# Patient Record
Sex: Female | Born: 1960 | Race: White | Hispanic: No | Marital: Single | Smoking: Never smoker
Health system: Southern US, Community
[De-identification: ages and names within clinical notes are randomized; demographics above are authoritative.]

## PROBLEM LIST (undated history)

## (undated) DIAGNOSIS — F319 Bipolar disorder, unspecified: Secondary | ICD-10-CM

## (undated) DIAGNOSIS — E039 Hypothyroidism, unspecified: Secondary | ICD-10-CM

## (undated) DIAGNOSIS — F32A Depression, unspecified: Secondary | ICD-10-CM

## (undated) DIAGNOSIS — M1990 Unspecified osteoarthritis, unspecified site: Secondary | ICD-10-CM

## (undated) DIAGNOSIS — F0390 Unspecified dementia without behavioral disturbance: Secondary | ICD-10-CM

## (undated) DIAGNOSIS — E079 Disorder of thyroid, unspecified: Secondary | ICD-10-CM

## (undated) HISTORY — DX: Disorder of thyroid, unspecified: E07.9

## (undated) HISTORY — PX: SHOULDER SURGERY: SHX246

## (undated) HISTORY — PX: TONSILLECTOMY: SUR1361

## (undated) HISTORY — DX: Bipolar disorder, unspecified: F31.9

## (undated) HISTORY — PX: CHOLECYSTECTOMY: SHX55

---

## 2019-02-08 DIAGNOSIS — F319 Bipolar disorder, unspecified: Secondary | ICD-10-CM | POA: Insufficient documentation

## 2019-02-08 DIAGNOSIS — M8580 Other specified disorders of bone density and structure, unspecified site: Secondary | ICD-10-CM | POA: Insufficient documentation

## 2019-02-08 DIAGNOSIS — E039 Hypothyroidism, unspecified: Secondary | ICD-10-CM | POA: Insufficient documentation

## 2019-02-08 DIAGNOSIS — M810 Age-related osteoporosis without current pathological fracture: Secondary | ICD-10-CM | POA: Insufficient documentation

## 2020-02-24 DIAGNOSIS — Z419 Encounter for procedure for purposes other than remedying health state, unspecified: Secondary | ICD-10-CM | POA: Diagnosis not present

## 2020-03-26 DIAGNOSIS — Z419 Encounter for procedure for purposes other than remedying health state, unspecified: Secondary | ICD-10-CM | POA: Diagnosis not present

## 2020-04-25 DIAGNOSIS — E559 Vitamin D deficiency, unspecified: Secondary | ICD-10-CM | POA: Diagnosis not present

## 2020-04-25 DIAGNOSIS — Z131 Encounter for screening for diabetes mellitus: Secondary | ICD-10-CM | POA: Diagnosis not present

## 2020-04-25 DIAGNOSIS — Z130 Encounter for screening for diseases of the blood and blood-forming organs and certain disorders involving the immune mechanism: Secondary | ICD-10-CM | POA: Diagnosis not present

## 2020-04-25 DIAGNOSIS — Z13220 Encounter for screening for lipoid disorders: Secondary | ICD-10-CM | POA: Diagnosis not present

## 2020-04-25 DIAGNOSIS — E039 Hypothyroidism, unspecified: Secondary | ICD-10-CM | POA: Diagnosis not present

## 2020-04-25 DIAGNOSIS — M19011 Primary osteoarthritis, right shoulder: Secondary | ICD-10-CM | POA: Diagnosis not present

## 2020-04-25 DIAGNOSIS — Z79899 Other long term (current) drug therapy: Secondary | ICD-10-CM | POA: Diagnosis not present

## 2020-04-25 DIAGNOSIS — G4700 Insomnia, unspecified: Secondary | ICD-10-CM | POA: Diagnosis not present

## 2020-04-25 DIAGNOSIS — M25511 Pain in right shoulder: Secondary | ICD-10-CM | POA: Diagnosis not present

## 2020-04-25 DIAGNOSIS — F418 Other specified anxiety disorders: Secondary | ICD-10-CM | POA: Diagnosis not present

## 2020-04-26 DIAGNOSIS — Z419 Encounter for procedure for purposes other than remedying health state, unspecified: Secondary | ICD-10-CM | POA: Diagnosis not present

## 2020-05-11 DIAGNOSIS — Z79899 Other long term (current) drug therapy: Secondary | ICD-10-CM | POA: Diagnosis not present

## 2020-05-11 DIAGNOSIS — Z131 Encounter for screening for diabetes mellitus: Secondary | ICD-10-CM | POA: Diagnosis not present

## 2020-05-11 DIAGNOSIS — E039 Hypothyroidism, unspecified: Secondary | ICD-10-CM | POA: Diagnosis not present

## 2020-05-11 DIAGNOSIS — Z0000 Encounter for general adult medical examination without abnormal findings: Secondary | ICD-10-CM | POA: Diagnosis not present

## 2020-05-11 DIAGNOSIS — E559 Vitamin D deficiency, unspecified: Secondary | ICD-10-CM | POA: Diagnosis not present

## 2020-05-11 DIAGNOSIS — Z13220 Encounter for screening for lipoid disorders: Secondary | ICD-10-CM | POA: Diagnosis not present

## 2020-05-11 DIAGNOSIS — Z130 Encounter for screening for diseases of the blood and blood-forming organs and certain disorders involving the immune mechanism: Secondary | ICD-10-CM | POA: Diagnosis not present

## 2020-05-24 DIAGNOSIS — E039 Hypothyroidism, unspecified: Secondary | ICD-10-CM | POA: Diagnosis not present

## 2020-05-24 DIAGNOSIS — E785 Hyperlipidemia, unspecified: Secondary | ICD-10-CM | POA: Diagnosis not present

## 2020-05-24 DIAGNOSIS — M810 Age-related osteoporosis without current pathological fracture: Secondary | ICD-10-CM | POA: Diagnosis not present

## 2020-05-24 DIAGNOSIS — E559 Vitamin D deficiency, unspecified: Secondary | ICD-10-CM | POA: Diagnosis not present

## 2020-05-26 DIAGNOSIS — Z419 Encounter for procedure for purposes other than remedying health state, unspecified: Secondary | ICD-10-CM | POA: Diagnosis not present

## 2020-06-06 DIAGNOSIS — K219 Gastro-esophageal reflux disease without esophagitis: Secondary | ICD-10-CM | POA: Diagnosis not present

## 2020-06-06 DIAGNOSIS — M810 Age-related osteoporosis without current pathological fracture: Secondary | ICD-10-CM | POA: Diagnosis not present

## 2020-06-06 DIAGNOSIS — Z13820 Encounter for screening for osteoporosis: Secondary | ICD-10-CM | POA: Diagnosis not present

## 2020-06-06 DIAGNOSIS — E559 Vitamin D deficiency, unspecified: Secondary | ICD-10-CM | POA: Diagnosis not present

## 2020-06-06 DIAGNOSIS — G4700 Insomnia, unspecified: Secondary | ICD-10-CM | POA: Diagnosis not present

## 2020-06-06 DIAGNOSIS — E039 Hypothyroidism, unspecified: Secondary | ICD-10-CM | POA: Diagnosis not present

## 2020-06-06 DIAGNOSIS — R42 Dizziness and giddiness: Secondary | ICD-10-CM | POA: Diagnosis not present

## 2020-06-06 DIAGNOSIS — F418 Other specified anxiety disorders: Secondary | ICD-10-CM | POA: Diagnosis not present

## 2020-06-06 DIAGNOSIS — Z1231 Encounter for screening mammogram for malignant neoplasm of breast: Secondary | ICD-10-CM | POA: Diagnosis not present

## 2020-06-06 DIAGNOSIS — R413 Other amnesia: Secondary | ICD-10-CM | POA: Diagnosis not present

## 2020-06-26 DIAGNOSIS — Z419 Encounter for procedure for purposes other than remedying health state, unspecified: Secondary | ICD-10-CM | POA: Diagnosis not present

## 2020-06-29 ENCOUNTER — Other Ambulatory Visit: Payer: Self-pay

## 2020-06-29 ENCOUNTER — Encounter: Payer: Self-pay | Admitting: Emergency Medicine

## 2020-06-29 ENCOUNTER — Ambulatory Visit
Admission: EM | Admit: 2020-06-29 | Discharge: 2020-06-29 | Disposition: A | Discharge status: Home / Self Care | Payer: Medicaid Other | Attending: Emergency Medicine | Admitting: Emergency Medicine

## 2020-06-29 DIAGNOSIS — L089 Local infection of the skin and subcutaneous tissue, unspecified: Secondary | ICD-10-CM

## 2020-06-29 DIAGNOSIS — R220 Localized swelling, mass and lump, head: Secondary | ICD-10-CM

## 2020-06-29 DIAGNOSIS — S01501A Unspecified open wound of lip, initial encounter: Secondary | ICD-10-CM

## 2020-06-29 HISTORY — DX: Unspecified dementia, unspecified severity, without behavioral disturbance, psychotic disturbance, mood disturbance, and anxiety: F03.90

## 2020-06-29 HISTORY — DX: Bipolar disorder, unspecified: F31.9

## 2020-06-29 MED ORDER — CLINDAMYCIN HCL 300 MG PO CAPS: 300.0000 mg | ORAL_CAPSULE | Freq: Four times a day (QID) | ORAL | 0 refills | Status: AC

## 2020-06-29 MED ORDER — CEFTRIAXONE SODIUM 1 G IJ SOLR
1.0000 g | Freq: Once | INTRAMUSCULAR | Status: AC
  Administered 2020-06-29: 1 g via INTRAMUSCULAR

## 2020-06-29 MED ORDER — PREDNISONE 20 MG PO TABS: 20.0000 mg | ORAL_TABLET | Freq: Two times a day (BID) | ORAL | 0 refills | Status: AC

## 2020-06-29 MED ORDER — DEXAMETHASONE SODIUM PHOSPHATE 10 MG/ML IJ SOLN
10.0000 mg | Freq: Once | INTRAMUSCULAR | Status: AC
  Administered 2020-06-29: 10 mg via INTRAMUSCULAR

## 2020-06-29 NOTE — ED Triage Notes (Addendum)
Swelling to bottom lip and blisters on the LT side of cheek x 2 days.  Pt had a pimple there and has been picking at it.  Pt is taking amoxicillin 500 mg TID she is on her second day of abx

## 2020-06-29 NOTE — ED Provider Notes (Signed)
[facility]   [id number] 06/29/20 Arrival Time: 1643  CC: Lip infection and swelling   SUBJECTIVE:  Anita Berry is a 59 y.o. female who presents with lip swelling and possible infection x 2 days.  Initially started out like a pimple on her lip, but has been picking at it and has caused lip pain and swelling.  Concerned for infection.  Family member who is a Education officer, community started her on amoxicillin 1 day ago.  Has not noticed improvement.  Localizes symptoms to LT lower lip.  Has tried OTC analgesics without relief.  Worse with chewing.  Denies similar symptoms in the past.  Denies fever, chills, dysphagia, odynophagia, oral or neck swelling, nausea, vomiting, chest pain, SOB.    ROS: As per HPI.  All other pertinent ROS negative.     Past Medical History:  Diagnosis Date  . Bipolar 1 disorder (HCC)   . Dementia [facility])    Past Surgical History:  Procedure Laterality Date  . CHOLECYSTECTOMY    . SHOULDER SURGERY Right   . TONSILLECTOMY     Allergies  Allergen Reactions  . Sulfa Antibiotics Swelling   No current facility-administered medications on file prior to encounter.   No current outpatient medications on file prior to encounter.   Social History   Socioeconomic History  . Marital status: Single    Spouse name: Not on file  . Number of children: Not on file  . Years of education: Not on file  . Highest education level: Not on file  Occupational History  . Not on file  Tobacco Use  . Smoking status: Never Smoker  . Smokeless tobacco: Never Used  Substance and Sexual Activity  . Alcohol use: Yes    Comment: occ  . Drug use: Never  . Sexual activity: Not on file  Other Topics Concern  . Not on file  Social History Narrative  . Not on file   Social Determinants of Health   Financial Resource Strain:   . Difficulty of Paying Living Expenses: Not on file  Food Insecurity:   . Worried About Programme researcher, broadcasting/film/video in the Last Year: Not on file  . Ran Out of  Food in the Last Year: Not on file  Transportation Needs:   . Lack of Transportation (Medical): Not on file  . Lack of Transportation (Non-Medical): Not on file  Physical Activity:   . Days of Exercise per Week: Not on file  . Minutes of Exercise per Session: Not on file  Stress:   . Feeling of Stress : Not on file  Social Connections:   . Frequency of Communication with Friends and Family: Not on file  . Frequency of Social Gatherings with Friends and Family: Not on file  . Attends Religious Services: Not on file  . Active Member of Clubs or Organizations: Not on file  . Attends Banker Meetings: Not on file  . Marital Status: Not on file  Intimate Partner Violence:   . Fear of Current or Ex-Partner: Not on file  . Emotionally Abused: Not on file  . Physically Abused: Not on file  . Sexually Abused: Not on file   No family history on file.  OBJECTIVE:  Vitals:   06/29/20 1714 06/29/20 1715  BP:  138/90  Pulse:  66  Resp:  19  Temp:  98.7 F (37.1 C)  TempSrc:  Oral  SpO2:  98%  Weight: 165 lb (74.8 kg)   Height: 5\' 6"  (1.676  m)     General appearance: alert; no distress HENT: normocephalic; atraumatic; LT lower lip with swelling and scab formation on outside, inside of LT lower lip with pustule, no active drainage or bleeding, TTP, dentition; poor Neck: supple without LAD Lungs: normal respirations Skin: warm and dry Psychological: alert and cooperative; normal mood and affect  ASSESSMENT & PLAN:  1. Lip swelling   2. Open wound of lip, unspecified open wound type, initial encounter   3. Wound infection     Meds ordered this encounter  Medications  . predniSONE (DELTASONE) 20 MG tablet    Sig: Take 1 tablet (20 mg total) by mouth 2 (two) times daily with a meal for 5 days.    Dispense:  10 tablet    Refill:  0    Order Specific Question:   Supervising Provider    Answer:   Eustace Moore [[id number]]  . clindamycin (CLEOCIN) 300 MG capsule     Sig: Take 1 capsule (300 mg total) by mouth every 6 (six) hours for 10 days.    Dispense:  40 capsule    Refill:  0    Order Specific Question:   Supervising Provider    Answer:   Eustace Moore [[id number]]  . dexamethasone (DECADRON) injection 10 mg  . cefTRIAXone (ROCEPHIN) injection 1 g    Steroid shot given in office Rocephin given in office Prednisone prescribed Clindamycin prescribed Take medications as directed and to completion Maintain oral hygiene care Return or go to the ED if you have any new or worsening symptoms such as fever, chills, difficulty swallowing, painful swallowing, oral or neck swelling, nausea, vomiting, chest pain, SOB, etc...  Reviewed expectations re: course of current medical issues. Questions answered. Outlined signs and symptoms indicating need for more acute intervention. Patient verbalized understanding. After Visit Summary given.   Rennis Harding, PA-C 06/29/20 1730

## 2020-06-29 NOTE — Discharge Instructions (Signed)
Steroid shot given in office Rocephin given in office Prednisone prescribed Clindamycin prescribed Take medications as directed and to completion Maintain oral hygiene care Return or go to the ED if you have any new or worsening symptoms such as fever, chills, difficulty swallowing, painful swallowing, oral or neck swelling, nausea, vomiting, chest pain, SOB, etc..Marland Kitchen

## 2020-07-05 ENCOUNTER — Other Ambulatory Visit (HOSPITAL_COMMUNITY): Payer: Self-pay | Admitting: Family Medicine

## 2020-07-05 DIAGNOSIS — Z780 Asymptomatic menopausal state: Secondary | ICD-10-CM

## 2020-07-05 DIAGNOSIS — Z1231 Encounter for screening mammogram for malignant neoplasm of breast: Secondary | ICD-10-CM

## 2020-07-26 DIAGNOSIS — Z419 Encounter for procedure for purposes other than remedying health state, unspecified: Secondary | ICD-10-CM | POA: Diagnosis not present

## 2020-08-26 DIAGNOSIS — Z419 Encounter for procedure for purposes other than remedying health state, unspecified: Secondary | ICD-10-CM | POA: Diagnosis not present

## 2020-08-30 DIAGNOSIS — E039 Hypothyroidism, unspecified: Secondary | ICD-10-CM | POA: Diagnosis not present

## 2020-09-05 DIAGNOSIS — K219 Gastro-esophageal reflux disease without esophagitis: Secondary | ICD-10-CM | POA: Diagnosis not present

## 2020-09-05 DIAGNOSIS — M810 Age-related osteoporosis without current pathological fracture: Secondary | ICD-10-CM | POA: Diagnosis not present

## 2020-09-05 DIAGNOSIS — E039 Hypothyroidism, unspecified: Secondary | ICD-10-CM | POA: Diagnosis not present

## 2020-09-05 DIAGNOSIS — E663 Overweight: Secondary | ICD-10-CM | POA: Diagnosis not present

## 2020-09-05 DIAGNOSIS — Z6827 Body mass index (BMI) 27.0-27.9, adult: Secondary | ICD-10-CM | POA: Diagnosis not present

## 2020-09-05 DIAGNOSIS — Z79899 Other long term (current) drug therapy: Secondary | ICD-10-CM | POA: Diagnosis not present

## 2020-09-05 DIAGNOSIS — R413 Other amnesia: Secondary | ICD-10-CM | POA: Diagnosis not present

## 2020-09-05 DIAGNOSIS — E559 Vitamin D deficiency, unspecified: Secondary | ICD-10-CM | POA: Diagnosis not present

## 2020-09-05 DIAGNOSIS — R739 Hyperglycemia, unspecified: Secondary | ICD-10-CM | POA: Diagnosis not present

## 2020-09-26 DIAGNOSIS — Z419 Encounter for procedure for purposes other than remedying health state, unspecified: Secondary | ICD-10-CM | POA: Diagnosis not present

## 2020-10-24 DIAGNOSIS — Z419 Encounter for procedure for purposes other than remedying health state, unspecified: Secondary | ICD-10-CM | POA: Diagnosis not present

## 2020-11-23 DIAGNOSIS — R739 Hyperglycemia, unspecified: Secondary | ICD-10-CM | POA: Diagnosis not present

## 2020-11-23 DIAGNOSIS — E559 Vitamin D deficiency, unspecified: Secondary | ICD-10-CM | POA: Diagnosis not present

## 2020-11-23 DIAGNOSIS — E039 Hypothyroidism, unspecified: Secondary | ICD-10-CM | POA: Diagnosis not present

## 2020-11-24 DIAGNOSIS — Z419 Encounter for procedure for purposes other than remedying health state, unspecified: Secondary | ICD-10-CM | POA: Diagnosis not present

## 2020-11-30 DIAGNOSIS — E039 Hypothyroidism, unspecified: Secondary | ICD-10-CM | POA: Diagnosis not present

## 2020-11-30 DIAGNOSIS — E559 Vitamin D deficiency, unspecified: Secondary | ICD-10-CM | POA: Diagnosis not present

## 2020-11-30 DIAGNOSIS — K5900 Constipation, unspecified: Secondary | ICD-10-CM | POA: Diagnosis not present

## 2020-11-30 DIAGNOSIS — M810 Age-related osteoporosis without current pathological fracture: Secondary | ICD-10-CM | POA: Diagnosis not present

## 2020-12-24 DIAGNOSIS — Z419 Encounter for procedure for purposes other than remedying health state, unspecified: Secondary | ICD-10-CM | POA: Diagnosis not present

## 2021-01-04 ENCOUNTER — Ambulatory Visit
Admission: EM | Admit: 2021-01-04 | Discharge: 2021-01-04 | Disposition: A | Discharge status: Home / Self Care | Payer: Medicaid Other | Attending: Emergency Medicine | Admitting: Emergency Medicine

## 2021-01-04 ENCOUNTER — Encounter: Payer: Self-pay | Admitting: Emergency Medicine

## 2021-01-04 ENCOUNTER — Other Ambulatory Visit: Payer: Self-pay

## 2021-01-04 DIAGNOSIS — L02415 Cutaneous abscess of right lower limb: Secondary | ICD-10-CM

## 2021-01-04 MED ORDER — CEFTRIAXONE SODIUM 1 G IJ SOLR
1.0000 g | Freq: Once | INTRAMUSCULAR | Status: AC
  Administered 2021-01-04: 1 g via INTRAMUSCULAR

## 2021-01-04 MED ORDER — DOXYCYCLINE HYCLATE 100 MG PO CAPS: 100.0000 mg | ORAL_CAPSULE | Freq: Two times a day (BID) | ORAL | 0 refills | Status: DC

## 2021-01-04 NOTE — ED Triage Notes (Signed)
Insect bite to RT thigh

## 2021-01-04 NOTE — ED Provider Notes (Signed)
  [facility]   [id number] 01/04/21 Arrival Time: 0830   [id number]  SUBJECTIVE:  Anita Berry is a 60 y.o. female who presents with a possible abscess of her RT upper thigh x few days. Speculates an insect may have bit her.  Has tried OTC medications without relief.  Sore to the touch.  Reports drainage, redness, and swelling.  Also reports fever of 102, body aches, and fatigue.  Denies nausea, or vomiting.    ROS: As per HPI.  All other pertinent ROS negative.     Past Medical History:  Diagnosis Date  . Bipolar 1 disorder (HCC)   . Dementia [facility])    Past Surgical History:  Procedure Laterality Date  . CHOLECYSTECTOMY    . SHOULDER SURGERY Right   . TONSILLECTOMY     Allergies  Allergen Reactions  . Sulfa Antibiotics Swelling   No current facility-administered medications on file prior to encounter.   No current outpatient medications on file prior to encounter.   Social History   Socioeconomic History  . Marital status: Single    Spouse name: Not on file  . Number of children: Not on file  . Years of education: Not on file  . Highest education level: Not on file  Occupational History  . Not on file  Tobacco Use  . Smoking status: Never Smoker  . Smokeless tobacco: Never Used  Substance and Sexual Activity  . Alcohol use: Yes    Comment: occ  . Drug use: Never  . Sexual activity: Not on file  Other Topics Concern  . Not on file  Social History Narrative  . Not on file   Social Determinants of Health   Financial Resource Strain: Not on file  Food Insecurity: Not on file  Transportation Needs: Not on file  Physical Activity: Not on file  Stress: Not on file  Social Connections: Not on file  Intimate Partner Violence: Not on file   No family history on file.  OBJECTIVE:  There were no vitals filed for this visit.   General appearance: alert; no distress Skin: apx 3-4 cm induration of her RT posterior upper thigh; tender to touch; no  active drainage Psychological: alert and cooperative; normal mood and affect  ASSESSMENT & PLAN:  1. Abscess of right thigh     Meds ordered this encounter  Medications  . doxycycline (VIBRAMYCIN) 100 MG capsule    Sig: Take 1 capsule (100 mg total) by mouth 2 (two) times daily.    Dispense:  20 capsule    Refill:  0    Order Specific Question:   Supervising Provider    Answer:   Eustace Moore [[id number]]  . cefTRIAXone (ROCEPHIN) injection 1 g   Rocephin shot given in office Apply warm compresses 3-4x daily for 10-15 minutes Wash site daily with warm water and mild soap Keep covered to avoid friction Take antibiotic as prescribed and to completion Follow up here or with PCP if symptoms persists Return or go to the ED if you have any new or worsening symptoms increased redness, swelling, pain, nausea, vomiting, fever, chills, etc...   Reviewed expectations re: course of current medical issues. Questions answered. Outlined signs and symptoms indicating need for more acute intervention. Patient verbalized understanding. After Visit Summary given.          Rennis Harding, PA-C 01/04/21 [phone]

## 2021-01-04 NOTE — Discharge Instructions (Signed)
Rocephin shot given in office Apply warm compresses 3-4x daily for 10-15 minutes Wash site daily with warm water and mild soap Keep covered to avoid friction Take antibiotic as prescribed and to completion Follow up here or with PCP if symptoms persists Return or go to the ED if you have any new or worsening symptoms increased redness, swelling, pain, nausea, vomiting, fever, chills, etc..Marland Kitchen

## 2021-01-24 DIAGNOSIS — Z419 Encounter for procedure for purposes other than remedying health state, unspecified: Secondary | ICD-10-CM | POA: Diagnosis not present

## 2021-02-23 DIAGNOSIS — Z419 Encounter for procedure for purposes other than remedying health state, unspecified: Secondary | ICD-10-CM | POA: Diagnosis not present

## 2021-03-05 ENCOUNTER — Other Ambulatory Visit: Payer: Self-pay | Admitting: *Deleted

## 2021-03-05 ENCOUNTER — Telehealth: Payer: Self-pay | Admitting: Nurse Practitioner

## 2021-03-05 ENCOUNTER — Other Ambulatory Visit: Payer: Self-pay

## 2021-03-05 ENCOUNTER — Ambulatory Visit (HOSPITAL_COMMUNITY)
Admission: RE | Admit: 2021-03-05 | Discharge: 2021-03-05 | Disposition: A | Discharge status: Home / Self Care | Payer: Medicaid Other | Source: Ambulatory Visit | Attending: Nurse Practitioner | Admitting: Nurse Practitioner

## 2021-03-05 ENCOUNTER — Ambulatory Visit: Payer: Medicaid Other | Admitting: Nurse Practitioner

## 2021-03-05 ENCOUNTER — Encounter: Payer: Self-pay | Admitting: Nurse Practitioner

## 2021-03-05 VITALS — BP 124/76 | HR 72 | Temp 98.7°F | Resp 18 | Ht 66.0 in | Wt 171.0 lb

## 2021-03-05 DIAGNOSIS — Z7689 Persons encountering health services in other specified circumstances: Secondary | ICD-10-CM | POA: Insufficient documentation

## 2021-03-05 DIAGNOSIS — Z780 Asymptomatic menopausal state: Secondary | ICD-10-CM

## 2021-03-05 DIAGNOSIS — E559 Vitamin D deficiency, unspecified: Secondary | ICD-10-CM

## 2021-03-05 DIAGNOSIS — S12290A Other displaced fracture of third cervical vertebra, initial encounter for closed fracture: Secondary | ICD-10-CM | POA: Diagnosis not present

## 2021-03-05 DIAGNOSIS — M542 Cervicalgia: Secondary | ICD-10-CM

## 2021-03-05 DIAGNOSIS — E039 Hypothyroidism, unspecified: Secondary | ICD-10-CM

## 2021-03-05 DIAGNOSIS — G4700 Insomnia, unspecified: Secondary | ICD-10-CM

## 2021-03-05 DIAGNOSIS — Z139 Encounter for screening, unspecified: Secondary | ICD-10-CM | POA: Diagnosis not present

## 2021-03-05 DIAGNOSIS — Z1231 Encounter for screening mammogram for malignant neoplasm of breast: Secondary | ICD-10-CM | POA: Diagnosis not present

## 2021-03-05 DIAGNOSIS — M4312 Spondylolisthesis, cervical region: Secondary | ICD-10-CM | POA: Diagnosis not present

## 2021-03-05 DIAGNOSIS — Z0001 Encounter for general adult medical examination with abnormal findings: Secondary | ICD-10-CM | POA: Insufficient documentation

## 2021-03-05 DIAGNOSIS — F319 Bipolar disorder, unspecified: Secondary | ICD-10-CM

## 2021-03-05 DIAGNOSIS — F0390 Unspecified dementia without behavioral disturbance: Secondary | ICD-10-CM

## 2021-03-05 DIAGNOSIS — M47812 Spondylosis without myelopathy or radiculopathy, cervical region: Secondary | ICD-10-CM | POA: Diagnosis not present

## 2021-03-05 MED ORDER — VENLAFAXINE HCL 75 MG PO TABS
75.0000 mg | ORAL_TABLET | Freq: Every day | ORAL | 0 refills | Status: DC | PRN
Start: 2021-03-05 — End: 2021-04-04

## 2021-03-05 NOTE — Assessment & Plan Note (Addendum)
-  she states that she has been taking Aricept for about a year -both parents had Alzheimer's

## 2021-03-05 NOTE — Telephone Encounter (Signed)
Pt needs a refill of the venlafaxine 75 mg --Send to [facility] in [place]

## 2021-03-05 NOTE — Assessment & Plan Note (Signed)
-  takes trazodone 100 mg qhs PRN

## 2021-03-05 NOTE — Telephone Encounter (Signed)
Pt medication sent to pharmacy  

## 2021-03-05 NOTE — Assessment & Plan Note (Signed)
-  will check thyroid panel with next set of labs 

## 2021-03-05 NOTE — Assessment & Plan Note (Addendum)
-  has hx of dementia, but has emotional lability today -swung from cheerful to tearful during our conversation -has issues from boyfriend when she was an adolescent committing suicide and her mother recently dying

## 2021-03-05 NOTE — Assessment & Plan Note (Signed)
-  takes vit D 2x/wk

## 2021-03-05 NOTE — Assessment & Plan Note (Signed)
-  fell after horse knocked her over 3 weeks ago -will get x-rays since she has crepitus

## 2021-03-05 NOTE — Assessment & Plan Note (Signed)
-  obtain records 

## 2021-03-05 NOTE — Patient Instructions (Signed)
Please have fasting labs drawn tomorrow, and we can discuss the results during your office visit.

## 2021-03-05 NOTE — Progress Notes (Signed)
New Patient Office Visit  Subjective:  Patient ID: Anita Berry, female    DOB: [birth year]  Age: 60 y.o. MRN: [id number]  CC:  Chief Complaint  Patient presents with   New Patient (Initial Visit)    New patient was a former [facility] pt would like to discuss prescription for a neck brace horse knocked her down 01-12-21 neck has been cracking and hurting would also like referral to mental health     HPI Anita Berry presents for new patient visit. Transferring care from the [facility]. Last physical about a year ago. Last labs were drawn about 3 months ago.  She would like mental health referral for bipolar disorder.  She states she had been on venlafaxine for probably 30 years, but she feels like she can't relax. She feels like she is always worried and has to be doing something constantly.  She states that 3 weeks ago a horse knocked her over and she hit her head on a rock. She states that she has crepitus in her neck when she moves her head.She states her pain is mild, but it is annoying to have sounds of bones cracking in her neck all the time.  Past Medical History:  Diagnosis Date   Bipolar 1 disorder ([place])    Bipolar disorder, unspecified ([place])    Dementia ([place])    Thyroid disease     Past Surgical History:  Procedure Laterality Date   CHOLECYSTECTOMY     SHOULDER SURGERY Right    TONSILLECTOMY      History reviewed. No pertinent family history.  Social History   Socioeconomic History   Marital status: Single    Spouse name: Not on file   Number of children: 0   Years of education: Not on file   Highest education level: Not on file  Occupational History   Occupation: Retired    Comment: Scientist, clinical (histocompatibility and immunogenetics)- consulting  Tobacco Use   Smoking status: Never   Smokeless tobacco: Never  Vaping Use   Vaping Use: Never used  Substance and Sexual Activity   Alcohol use: Yes    Comment: none recently   Drug use: Never   Sexual activity: Not Currently   Other Topics Concern   Not on file  Social History Narrative   Never been married; boyfriend when she was 14 committed suicide, and she hasn't had solid relationship since   Social Determinants of Health   Financial Resource Strain: Not on file  Food Insecurity: Not on file  Transportation Needs: Not on file  Physical Activity: Not on file  Stress: Not on file  Social Connections: Not on file  Intimate Partner Violence: Not on file    ROS Review of Systems  Constitutional: Negative.   Respiratory: Negative.    Cardiovascular: Negative.   Musculoskeletal:        Frequent muscle cramps lately; don't last but a few seconds  Psychiatric/Behavioral: Negative.     Objective:   Today's Vitals: BP 124/76 (BP Location: Right Arm, Patient Position: Sitting, Cuff Size: Normal)   Pulse 72   Temp 98.7 F (37.1 C) (Oral)   Resp 18   Ht '5\' 6"'  (1.676 m)   Wt 171 lb (77.6 kg)   SpO2 97%   BMI 27.60 kg/m   Physical Exam Constitutional:      Appearance: Normal appearance.  Cardiovascular:     Rate and Rhythm: Normal rate and regular rhythm.     Pulses: Normal pulses.  Heart sounds: Normal heart sounds.  Pulmonary:     Effort: Pulmonary effort is normal.     Breath sounds: Normal breath sounds.  Musculoskeletal:        General: No swelling or tenderness.     Comments: She states she hit her head 3 weeks ago and has crepitus when moving her neck  Neurological:     Mental Status: She is alert.  Psychiatric:     Comments: Tearful when discussing her boyfriend that committed suicide at age 24    Assessment & Plan:   Problem List Items Addressed This Visit       Endocrine   Hypothyroidism    -will check thyroid panel with next set of labs       Relevant Medications   levothyroxine (SYNTHROID) 50 MCG tablet     Nervous and Auditory   Dementia ([place])    -she states that she has been taking Aricept for about a year -both parents had Alzheimer's       Relevant  Medications   donepezil (ARICEPT) 5 MG tablet   venlafaxine (EFFEXOR) 75 MG tablet   traZODone (DESYREL) 100 MG tablet     Other   Encounter to establish care - Primary    -obtain records       Relevant Orders   CBC with Differential/Platelet   CMP14+EGFR   Lipid Panel With LDL/HDL Ratio   TSH + free T4   Screening due    -checking HCV and HIV with next set of labs       Relevant Orders   Hepatitis C antibody   HIV Antibody (routine testing w rflx)   Bipolar disorder ([place])   Relevant Orders   Ambulatory referral to Psychiatry   Insomnia    -takes trazodone 100 mg qhs PRN       Vitamin D deficiency    -takes vit D 2x/wk       Relevant Orders   VITAMIN D 25 Hydroxy (Vit-D Deficiency, Fractures)   Neck pain    -fell after horse knocked her over 3 weeks ago -will get x-rays since she has crepitus       Relevant Orders   DG Cervical Spine Complete   Other Visit Diagnoses     Screening mammogram for breast cancer       Relevant Orders   MM 3D SCREEN BREAST BILATERAL   Post-menopausal       Relevant Orders   DG Bone Density       Outpatient Encounter Medications as of 03/05/2021  Medication Sig   donepezil (ARICEPT) 5 MG tablet Take 5 mg by mouth daily.   levothyroxine (SYNTHROID) 50 MCG tablet levothyroxine 50 mcg tablet   traZODone (DESYREL) 100 MG tablet Take 100 mg by mouth at bedtime.   venlafaxine (EFFEXOR) 75 MG tablet Take 75 mg by mouth daily as needed.   Vitamin D, Ergocalciferol, (DRISDOL) 1.25 MG (50000 UNIT) CAPS capsule Take 50,000 Units by mouth 2 (two) times a week.   [DISCONTINUED] doxycycline (VIBRAMYCIN) 100 MG capsule Take 1 capsule (100 mg total) by mouth 2 (two) times daily.   No facility-administered encounter medications on file as of 03/05/2021.    Follow-up: Return in about 2 weeks (around 03/19/2021) for Physical Exam.   Noreene Larsson, NP

## 2021-03-05 NOTE — Assessment & Plan Note (Signed)
-  checking HCV and HIV with next set of labs

## 2021-03-06 DIAGNOSIS — F0390 Unspecified dementia without behavioral disturbance: Secondary | ICD-10-CM | POA: Diagnosis not present

## 2021-03-06 DIAGNOSIS — R7301 Impaired fasting glucose: Secondary | ICD-10-CM | POA: Diagnosis not present

## 2021-03-06 DIAGNOSIS — M810 Age-related osteoporosis without current pathological fracture: Secondary | ICD-10-CM | POA: Diagnosis not present

## 2021-03-06 DIAGNOSIS — E559 Vitamin D deficiency, unspecified: Secondary | ICD-10-CM | POA: Diagnosis not present

## 2021-03-06 DIAGNOSIS — Z114 Encounter for screening for human immunodeficiency virus [HIV]: Secondary | ICD-10-CM | POA: Diagnosis not present

## 2021-03-06 DIAGNOSIS — Z13220 Encounter for screening for lipoid disorders: Secondary | ICD-10-CM | POA: Diagnosis not present

## 2021-03-06 DIAGNOSIS — E039 Hypothyroidism, unspecified: Secondary | ICD-10-CM | POA: Diagnosis not present

## 2021-03-06 DIAGNOSIS — Z7689 Persons encountering health services in other specified circumstances: Secondary | ICD-10-CM | POA: Diagnosis not present

## 2021-03-07 ENCOUNTER — Ambulatory Visit (HOSPITAL_COMMUNITY)
Admission: RE | Admit: 2021-03-07 | Discharge: 2021-03-07 | Disposition: A | Discharge status: Home / Self Care | Payer: Medicaid Other | Source: Ambulatory Visit | Attending: Nurse Practitioner | Admitting: Nurse Practitioner

## 2021-03-07 ENCOUNTER — Other Ambulatory Visit: Payer: Self-pay

## 2021-03-07 ENCOUNTER — Telehealth: Payer: Self-pay

## 2021-03-07 DIAGNOSIS — M85852 Other specified disorders of bone density and structure, left thigh: Secondary | ICD-10-CM | POA: Diagnosis not present

## 2021-03-07 DIAGNOSIS — Z780 Asymptomatic menopausal state: Secondary | ICD-10-CM

## 2021-03-07 DIAGNOSIS — Z1231 Encounter for screening mammogram for malignant neoplasm of breast: Secondary | ICD-10-CM | POA: Insufficient documentation

## 2021-03-07 LAB — CBC WITH DIFFERENTIAL/PLATELET
Basophils Absolute: 0 10*3/uL (ref 0.0–0.2)
Basos: 1 %
EOS (ABSOLUTE): 0.1 10*3/uL (ref 0.0–0.4)
Eos: 1 %
Hematocrit: 40.1 % (ref 34.0–46.6)
Hemoglobin: 13.3 g/dL (ref 11.1–15.9)
Immature Grans (Abs): 0 10*3/uL (ref 0.0–0.1)
Immature Granulocytes: 0 %
Lymphocytes Absolute: 0.9 10*3/uL (ref 0.7–3.1)
Lymphs: 19 %
MCH: 30.7 pg (ref 26.6–33.0)
MCHC: 33.2 g/dL (ref 31.5–35.7)
MCV: 93 fL (ref 79–97)
Monocytes Absolute: 0.3 10*3/uL (ref 0.1–0.9)
Monocytes: 6 %
Neutrophils Absolute: 3.7 10*3/uL (ref 1.4–7.0)
Neutrophils: 73 %
Platelets: 216 10*3/uL (ref 150–450)
RBC: 4.33 x10E6/uL (ref 3.77–5.28)
RDW: 12.5 % (ref 11.7–15.4)
WBC: 5 10*3/uL (ref 3.4–10.8)

## 2021-03-07 LAB — HIV ANTIBODY (ROUTINE TESTING W REFLEX): HIV Screen 4th Generation wRfx: NONREACTIVE

## 2021-03-07 LAB — CMP14+EGFR
ALT: 15 IU/L (ref 0–32)
AST: 24 IU/L (ref 0–40)
Albumin/Globulin Ratio: 2.3 — ABNORMAL HIGH (ref 1.2–2.2)
Albumin: 4.4 g/dL (ref 3.8–4.8)
Alkaline Phosphatase: 173 IU/L — ABNORMAL HIGH (ref 44–121)
BUN/Creatinine Ratio: 15 (ref 12–28)
BUN: 18 mg/dL (ref 8–27)
Bilirubin Total: 0.4 mg/dL (ref 0.0–1.2)
CO2: 20 mmol/L (ref 20–29)
Calcium: 9.1 mg/dL (ref 8.7–10.3)
Chloride: 106 mmol/L (ref 96–106)
Creatinine, Ser: 1.19 mg/dL — ABNORMAL HIGH (ref 0.57–1.00)
Globulin, Total: 1.9 g/dL (ref 1.5–4.5)
Glucose: 104 mg/dL — ABNORMAL HIGH (ref 65–99)
Potassium: 3.8 mmol/L (ref 3.5–5.2)
Sodium: 141 mmol/L (ref 134–144)
Total Protein: 6.3 g/dL (ref 6.0–8.5)
eGFR: 52 mL/min/{1.73_m2} — ABNORMAL LOW (ref >59)

## 2021-03-07 LAB — LIPID PANEL WITH LDL/HDL RATIO
Cholesterol, Total: 202 mg/dL — ABNORMAL HIGH (ref 100–199)
HDL: 117 mg/dL (ref >39)
LDL Chol Calc (NIH): 75 mg/dL (ref 0–99)
LDL/HDL Ratio: 0.6 ratio (ref 0.0–3.2)
Triglycerides: 51 mg/dL (ref 0–149)
VLDL Cholesterol Cal: 10 mg/dL (ref 5–40)

## 2021-03-07 LAB — TSH+FREE T4
Free T4: 1.32 ng/dL (ref 0.82–1.77)
TSH: 2.39 u[IU]/mL (ref 0.450–4.500)

## 2021-03-07 LAB — VITAMIN D 25 HYDROXY (VIT D DEFICIENCY, FRACTURES): Vit D, 25-Hydroxy: 38.9 ng/mL (ref 30.0–100.0)

## 2021-03-07 LAB — HEPATITIS C ANTIBODY: Hep C Virus Ab: <0.1 s/co ratio (ref 0.0–0.9)

## 2021-03-07 NOTE — Telephone Encounter (Signed)
Spoke with [facility] Radiology let her know results were in chart NP would advise next steps

## 2021-03-07 NOTE — Telephone Encounter (Signed)
[facility] called left a voice mail at 10:49 am with a STAT call report on patient cervical spine today. Please return nurse call [phone].

## 2021-03-07 NOTE — Progress Notes (Signed)
Labs look good.   Kidney function is a little bit low, but that is not likely new. We will have to compare with labs from [facility] when they come in.  I'll discuss elevated AP with her at her appointment.  Did she get the neck x-ray?  Please get [place] to add an A1c for R73.01.

## 2021-03-08 ENCOUNTER — Telehealth: Payer: Medicaid Other

## 2021-03-08 ENCOUNTER — Other Ambulatory Visit: Payer: Self-pay | Admitting: Nurse Practitioner

## 2021-03-08 ENCOUNTER — Ambulatory Visit: Payer: Medicaid Other

## 2021-03-08 DIAGNOSIS — M542 Cervicalgia: Secondary | ICD-10-CM

## 2021-03-08 MED ORDER — CERVICAL COLLAR ADJUSTABLE MISC: 0 refills | Status: DC

## 2021-03-08 NOTE — Progress Notes (Signed)
With minimal displacement, I am not going to do any referrals unless you override that.  Pt fell 3 weeks ago and has noticed some crepitus, but no pain. X-ray performed.

## 2021-03-08 NOTE — Telephone Encounter (Signed)
Pt advised with verbal understanding  °

## 2021-03-08 NOTE — Progress Notes (Signed)
Notified pt that she has fracture to spinous process of C3. Rx. C-collar and referral to ortho.

## 2021-03-08 NOTE — Telephone Encounter (Signed)
I called patient and sent in c-collar and referred her to ortho.

## 2021-03-09 LAB — SPECIMEN STATUS REPORT

## 2021-03-09 LAB — HEMOGLOBIN A1C
Est. average glucose Bld gHb Est-mCnc: 111 mg/dL
Hgb A1c MFr Bld: 5.5 % (ref 4.8–5.6)

## 2021-03-12 NOTE — Progress Notes (Signed)
No malignancy on mammogram. Repeat test in 1 year.

## 2021-03-14 NOTE — Progress Notes (Signed)
She has osteopenia on her DEXA scan. This means her bones a thin, but not to the point of osteoporosis. No need to add or change medicines. Increasing weight-bearing activity like walking or dancing can help build stronger bones as well as having plenty of calcium in her diet.

## 2021-03-15 ENCOUNTER — Other Ambulatory Visit: Payer: Self-pay

## 2021-03-15 ENCOUNTER — Ambulatory Visit (INDEPENDENT_AMBULATORY_CARE_PROVIDER_SITE_OTHER): Payer: Medicaid Other | Admitting: Licensed Clinical Social Worker

## 2021-03-15 DIAGNOSIS — F3131 Bipolar disorder, current episode depressed, mild: Secondary | ICD-10-CM

## 2021-03-19 ENCOUNTER — Telehealth: Payer: Self-pay | Admitting: Nurse Practitioner

## 2021-03-19 ENCOUNTER — Other Ambulatory Visit: Payer: Self-pay

## 2021-03-19 DIAGNOSIS — G4700 Insomnia, unspecified: Secondary | ICD-10-CM

## 2021-03-19 MED ORDER — TRAZODONE HCL 100 MG PO TABS: 100.0000 mg | ORAL_TABLET | Freq: Every day | ORAL | 1 refills | Status: DC

## 2021-03-19 NOTE — Telephone Encounter (Signed)
Rx sent 

## 2021-03-19 NOTE — Telephone Encounter (Signed)
Please send trazodone in

## 2021-03-20 ENCOUNTER — Ambulatory Visit (INDEPENDENT_AMBULATORY_CARE_PROVIDER_SITE_OTHER): Payer: Medicaid Other | Admitting: Orthopaedic Surgery

## 2021-03-20 ENCOUNTER — Other Ambulatory Visit: Payer: Self-pay

## 2021-03-20 ENCOUNTER — Encounter: Payer: Self-pay | Admitting: Orthopaedic Surgery

## 2021-03-20 VITALS — BP 124/67 | HR 63 | Ht 66.0 in | Wt 169.0 lb

## 2021-03-20 DIAGNOSIS — S12291A Other nondisplaced fracture of third cervical vertebra, initial encounter for closed fracture: Secondary | ICD-10-CM | POA: Diagnosis not present

## 2021-03-20 NOTE — Progress Notes (Signed)
Subjective:    Patient ID: Anita Berry, female    DOB: [birth year], 60 y.o.   MRN: [id number]  HPI She was pushed down by a horse and hit her head about five weeks ago.  She had neck pain that would not go away.  She has no weakness, no dizziness, no spasm.  She went to [facility] on 03-05-21.  X-rays were done of the neck and it showed:  IMPRESSION: Acute minimally displaced fracture of the C3 spinous process.   Cervical spine spondylosis as described above.   These results will be called to the ordering clinician or representative by the Radiologist Assistant, and communication documented in the PACS or [employer].   An appointment was made here.  I have reviewed the primary care notes.  I have independently reviewed and interpreted x-rays of this patient done at another site by another physician or qualified health professional.  She is doing well.  She has a cervical collar applied from the Primary Care.  She has some tenderness of the neck and is better.  I have told her I do not take care of fractures of the cervical spine.  Her fracture is stable as she had no neurological problem for three weeks after her injury before seeing the primary care.  She will most likely continue to improve.  However, I feel she should be seen by neurosurgery anyway to make sure no other problem with the neck area.  She agrees.    Review of Systems  Constitutional:  Positive for activity change.  Musculoskeletal:  Positive for neck pain.  All other systems reviewed and are negative. For Review of Systems, all other systems reviewed and are negative.  The following is a summary of the past history medically, past history surgically, known current medicines, social history and family history.  This information is gathered electronically by the computer from prior information and documentation.  I review this each visit and have found including this information at this point in the  chart is beneficial and informative.   Past Medical History:  Diagnosis Date   Bipolar 1 disorder (HCC)    Bipolar disorder, unspecified (HCC)    Dementia (HCC)    Thyroid disease     Past Surgical History:  Procedure Laterality Date   CHOLECYSTECTOMY     SHOULDER SURGERY Right    TONSILLECTOMY      Current Outpatient Medications on File Prior to Visit  Medication Sig Dispense Refill   donepezil (ARICEPT) 5 MG tablet Take 5 mg by mouth daily.     Elastic Bandages & Supports (CERVICAL COLLAR ADJUSTABLE) MISC Use c-collar during the day until cleared by orthopedics. 1 each 0   levothyroxine (SYNTHROID) 50 MCG tablet levothyroxine 50 mcg tablet     traZODone (DESYREL) 100 MG tablet Take 1 tablet (100 mg total) by mouth at bedtime. 30 tablet 1   venlafaxine (EFFEXOR) 75 MG tablet Take 1 tablet (75 mg total) by mouth daily as needed. 30 tablet 0   Vitamin D, Ergocalciferol, (DRISDOL) 1.25 MG (50000 UNIT) CAPS capsule Take 50,000 Units by mouth 2 (two) times a week.     No current facility-administered medications on file prior to visit.    Social History   Socioeconomic History   Marital status: Single    Spouse name: Not on file   Number of children: 0   Years of education: Not on file   Highest education level: Not on file  Occupational History  Occupation: Retired    Comment: Naval architect- consulting  Tobacco Use   Smoking status: Never   Smokeless tobacco: Never  Vaping Use   Vaping Use: Never used  Substance and Sexual Activity   Alcohol use: Yes    Comment: none recently   Drug use: Never   Sexual activity: Not Currently  Other Topics Concern   Not on file  Social History Narrative   Never been married; boyfriend when she was 14 committed suicide, and she hasn't had solid relationship since   Social Determinants of Health   Financial Resource Strain: Not on file  Food Insecurity: Not on file  Transportation Needs: Not on file  Physical Activity: Not  on file  Stress: Not on file  Social Connections: Not on file  Intimate Partner Violence: Not on file    No family history on file.  BP 124/67   Pulse 63   Ht 5\' 6"  (1.676 m)   Wt 169 lb (76.7 kg)   BMI 27.28 kg/m   Body mass index is 27.28 kg/m.     Objective:   Physical Exam Vitals and nursing note reviewed. Exam conducted with a chaperone present.  Constitutional:      Appearance: She is well-developed.  HENT:     Head: Normocephalic and atraumatic.  Eyes:     Conjunctiva/sclera: Conjunctivae normal.     Pupils: Pupils are equal, round, and reactive to light.  Cardiovascular:     Rate and Rhythm: Normal rate and regular rhythm.  Pulmonary:     Effort: Pulmonary effort is normal.  Abdominal:     Palpations: Abdomen is soft.  Musculoskeletal:       Arms:     Cervical back: Normal range of motion and neck supple.  Skin:    General: Skin is warm and dry.  Neurological:     Mental Status: She is alert and oriented to person, place, and time.     Cranial Nerves: No cranial nerve deficit.     Motor: No abnormal muscle tone.     Coordination: Coordination normal.     Deep Tendon Reflexes: Reflexes are normal and symmetric. Reflexes normal.  Psychiatric:        Behavior: Behavior normal.        Thought Content: Thought content normal.        Judgment: Judgment normal.          Assessment & Plan:   Encounter Diagnosis  Name Primary?   Other closed nondisplaced fracture of third cervical vertebra, initial encounter [facility]) Yes   To neurosurgery for further evaluation.  Call if any problem.  Precautions discussed.  Electronically Signed [facility], MD 7/26/20228:27 AM

## 2021-03-20 NOTE — Patient Instructions (Signed)
You have been referred to [facility] on [address] in [place], they will call you with appointment. If you have not heard anything in one week call them to schedule [phone]   

## 2021-03-20 NOTE — Progress Notes (Signed)
Letter mailed

## 2021-03-21 ENCOUNTER — Inpatient Hospital Stay
Admission: RE | Admit: 2021-03-21 | Discharge: 2021-03-21 | Disposition: A | Discharge status: Home / Self Care | Payer: Self-pay | Source: Ambulatory Visit | Attending: Nurse Practitioner | Admitting: Nurse Practitioner

## 2021-03-21 ENCOUNTER — Other Ambulatory Visit (HOSPITAL_COMMUNITY): Payer: Self-pay | Admitting: General Practice

## 2021-03-21 ENCOUNTER — Telehealth: Payer: Self-pay | Admitting: Licensed Clinical Social Worker

## 2021-03-21 DIAGNOSIS — Z1231 Encounter for screening mammogram for malignant neoplasm of breast: Secondary | ICD-10-CM

## 2021-03-21 DIAGNOSIS — S12291A Other nondisplaced fracture of third cervical vertebra, initial encounter for closed fracture: Secondary | ICD-10-CM | POA: Diagnosis not present

## 2021-03-21 DIAGNOSIS — Z6827 Body mass index (BMI) 27.0-27.9, adult: Secondary | ICD-10-CM | POA: Diagnosis not present

## 2021-03-21 DIAGNOSIS — F3131 Bipolar disorder, current episode depressed, mild: Secondary | ICD-10-CM

## 2021-03-21 NOTE — BH Specialist Note (Signed)
Virtual Behavioral Health Treatment Plan Team Note  MRN: [id number] NAME: Anita Berry  DATE: 04/18/21  Start time: 205p  End time:  215p Total time:   Total number of Virtual BH Treatment Team Plan encounters: 1/4  Treatment Team Attendees: Nolon Rod, LCSW, Dr. Vanetta Shawl, Psychiatrist  Diagnoses:    ICD-10-CM   1. Bipolar affective disorder, currently depressed, mild (HCC)  F31.31       Goals, Interventions and Follow-up Plan Goals: Increase healthy adjustment to current life circumstances Interventions: Motivational Interviewing Behavioral Activation CBT Cognitive Behavioral Therapy Medication Management Recommendations: no changes in medication at this time Follow-up Plan: Weekly VBH sessions  History of the present illness Presenting Problem/Current Symptoms: Continued symptoms of diagnosis  Psychiatric History  Depression: No Anxiety: No Mania: Yes Psychosis: No PTSD symptoms: No  Past Psychiatric History/Hospitalization(s): Hospitalization for psychiatric illness: No Prior Suicide Attempts: No Prior Self-injurious behavior: No  Psychosocial stressors employment isolation  Self-harm Behaviors Risk Assessment none  Screenings PHQ-9 Assessments:  Depression screen [facility] 2/9 03/28/2021 03/21/2021 03/05/2021  Decreased Interest 3 3 0  Down, Depressed, Hopeless 3 3 3   PHQ - 2 Score 6 6 3   Altered sleeping 3 3 3   Tired, decreased energy 0 2 0  Change in appetite 1 2 3   Feeling bad or failure about yourself  3 1 3   Trouble concentrating 2 2 3   Moving slowly or fidgety/restless 2 1 0  Suicidal thoughts 0 0 0  PHQ-9 Score 17 17 15   Difficult doing work/chores Extremely dIfficult Somewhat difficult Very difficult   GAD-7 Assessments:  GAD 7 : Generalized Anxiety Score 03/21/2021  Nervous, Anxious, on Edge 1  Control/stop worrying 1  Worry too much - different things 1  Trouble relaxing 0  Restless 0  Easily annoyed or irritable 0  Afraid - awful  might happen 1  Total GAD 7 Score 4  Anxiety Difficulty Not difficult at all    Past Medical History Past Medical History:  Diagnosis Date   Bipolar 1 disorder (HCC)    Bipolar disorder, unspecified (HCC)    Dementia (HCC)    Thyroid disease     Vital signs: There were no vitals filed for this visit.  Allergies:  Allergies as of 03/21/2021 - Review Complete 03/20/2021  Allergen Reaction Noted   Sulfa antibiotics Swelling 06/29/2020    Medication History Current medications:  Outpatient Encounter Medications as of 03/21/2021  Medication Sig   donepezil (ARICEPT) 5 MG tablet Take 5 mg by mouth daily.   levothyroxine (SYNTHROID) 50 MCG tablet levothyroxine 50 mcg tablet   traZODone (DESYREL) 100 MG tablet Take 1 tablet (100 mg total) by mouth at bedtime.   [DISCONTINUED] Elastic Bandages & Supports (CERVICAL COLLAR ADJUSTABLE) MISC Use c-collar during the day until cleared by orthopedics. (Patient not taking: Reported on 03/28/2021)   [DISCONTINUED] venlafaxine (EFFEXOR) 75 MG tablet Take 1 tablet (75 mg total) by mouth daily as needed.   [DISCONTINUED] Vitamin D, Ergocalciferol, (DRISDOL) 1.25 MG (50000 UNIT) CAPS capsule Take 50,000 Units by mouth 2 (two) times a week.   No facility-administered encounter medications on file as of 03/21/2021.     Scribe for Treatment Team: 03/23/2021, LCSW

## 2021-03-21 NOTE — Progress Notes (Signed)
Virtual behavioral Health Initiative (vBHI) Psychiatric Consultant Case Review   Anita Berry is a 60 y.o. year old female with a history of bipolar, dementia by history,minimally displaced fracture of C 3 spinous process after falling off a hoarse few weeks ago. She lives with her sister. Psychosocial stressors includes complicated grief of loss of her boyfriend, who died by suicide when she was 68 year old.   Assessment/Provisional Diagnosis # Mood disorder R/o depression  History of bipolar disorder There is a documented history of bipolar disorder, although her clinical history is more aligned with depression.  BH specialist to explore of this diagnosis.  Continue current dose of venlafaxine at this time for depression.   # history of dementia There is a documented history of dementia, and she has been on Aricept.  Will do further evaluation at the next visit.   Recommendation  - Continue venlafaxine 75 mg daily - Continue Trazodone 100 mg at night as needed for sleep  - on donepezil 5 mg daily - BH specialist to work on self compassion, behavioral activation  Thank you for your consult. We will continue to follow the patient. Please contact vBHI  for any questions or concerns.   The above treatment considerations and suggestions are based on consultation with the [facility] specialist and/or PCP and a review of information available in the shared registry and the patient's Electronic Health Record (EHR). I have not personally examined the patient. All recommendations should be implemented with consideration of the patient's relevant prior history and current clinical status. Please feel free to call me with any questions about the care of this patient.

## 2021-03-21 NOTE — Progress Notes (Signed)
[facility] Initial Clinical Assessment  MRN: [id number] NAME: Areana Kosanke Date: 03/21/21  Start time: 12 End time: 1230 Total time: 30  Type of Contact: video (Initial Telephone Assessment or Urgent Video Visit) Patient consent obtained: Yes  Referring Provider: [facility] Reason for Visit today: begin services  Treatment History Patient recently received Inpatient Treatment: No    Patient currently being seen by therapist/psychiatrist: No  Patient currently receiving the following services: none  Past Psychiatric History/Diagnosis/Hospitalization(s): Anxiety: No Bipolar Disorder: Yes Depression: No Mania: No Psychosis: No Schizophrenia: No Personality Disorder: No Hospitalization for psychiatric illness: No History of Electroconvulsive Shock Therapy: No Prior Suicide Attempts: No  Decreased need for sleep: Yes  Euphoria: No  Self Injurious behaviors: No  Family History of mental illness: Yes  Family History of substance abuse: No  Substance Abuse: No  DUI: No  Insomnia: No   History of violence: No  Physical, sexual or emotional abuse: No  Prior outpatient mental health therapy: Yes   Clinical Assessment  PHQ-9 & GAD-7 Assessments: This is an evidence based assessment tool for depression and anxiety symptoms in adolescents and adults.  Score cut-off points for each section are as follows: 5-9: Mild, 10-14: Moderate, 15+: Severe  PHQ-9 for Depression = 17   GAD-7 for Anxiety = 4   How difficult have these problems made it for you to do your work, take care of things at home, or get along with other people? difficult   Social Functioning Social maturity: WNL Social judgement: WNL  Stress Current stressors: past trauma Familial stressors: none Sleep: poor Appetite: poor Coping ability: overwhelmed Patient taking medications as prescribed:  yes  Current medications:  Outpatient Encounter Medications as of 03/15/2021  Medication Sig   donepezil  (ARICEPT) 5 MG tablet Take 5 mg by mouth daily.   Elastic Bandages & Supports (CERVICAL COLLAR ADJUSTABLE) MISC Use c-collar during the day until cleared by orthopedics.   levothyroxine (SYNTHROID) 50 MCG tablet levothyroxine 50 mcg tablet   venlafaxine (EFFEXOR) 75 MG tablet Take 1 tablet (75 mg total) by mouth daily as needed.   Vitamin D, Ergocalciferol, (DRISDOL) 1.25 MG (50000 UNIT) CAPS capsule Take 50,000 Units by mouth 2 (two) times a week.   [DISCONTINUED] traZODone (DESYREL) 100 MG tablet Take 100 mg by mouth at bedtime.   No facility-administered encounter medications on file as of 03/15/2021.     Self-harm and/or Suicidal Behaviors Risk Assessment Self-harm risk factors: n/a Patient endorses recent self injurious thoughts and/or behaviors: No  Suicide ideations: No plan to harm self or others   Danger to Others Risk Assessment Danger to others risk factors: no Patient endorses recent thoughts of harming others: No    Substance Use Assessment Patient recently consumed alcohol: No  Patient recently used drugs: No  Patient is concerned about dependence or abuse of substances: No    Goals, Interventions and Follow-up Plan Goals: Increase healthy adjustment to current life circumstances Interventions: Motivational Interviewing, Mining engineer, and CBT Cognitive Behavioral Therapy Follow-up Plan:  Weekly VBH sessions  Summary of Clinical Assessment Summary: Valynn is a 60 yr old woman who was referred by Bjorn Pippin, NP.  She is currently in a neck brace after falling off a horse and hitting her head on a rock.  She has difficulty with rotating her neck.  She was tearful throughout the session about the trauma in her past.  She is currently living with her sister for the past several years.  She is  fearful about being alone.  While she was 48 yrs old her boyfriend committed suicide and his family blamed her.  She has struggled with relationships, connecting with people  since.  She has engaged in a relationship while in college but he moved away and she has not opened up to anyone since.  She reports poor appetite, eating habits and sleep schedule. Provided psychoeducation on her diagnosis and her medication   Marinda Elk, LCSW

## 2021-03-22 ENCOUNTER — Other Ambulatory Visit: Payer: Self-pay

## 2021-03-22 ENCOUNTER — Telehealth: Payer: Medicaid Other

## 2021-03-22 ENCOUNTER — Ambulatory Visit: Payer: Medicaid Other

## 2021-03-28 ENCOUNTER — Encounter: Payer: Self-pay | Admitting: Nurse Practitioner

## 2021-03-28 ENCOUNTER — Other Ambulatory Visit: Payer: Self-pay

## 2021-03-28 ENCOUNTER — Ambulatory Visit (INDEPENDENT_AMBULATORY_CARE_PROVIDER_SITE_OTHER): Payer: Medicaid Other | Admitting: Nurse Practitioner

## 2021-03-28 VITALS — BP 96/69 | HR 73 | Temp 97.4°F | Ht 66.0 in | Wt 169.0 lb

## 2021-03-28 DIAGNOSIS — M8000XD Age-related osteoporosis with current pathological fracture, unspecified site, subsequent encounter for fracture with routine healing: Secondary | ICD-10-CM

## 2021-03-28 DIAGNOSIS — F3131 Bipolar disorder, current episode depressed, mild: Secondary | ICD-10-CM

## 2021-03-28 DIAGNOSIS — Z0001 Encounter for general adult medical examination with abnormal findings: Secondary | ICD-10-CM

## 2021-03-28 DIAGNOSIS — S12201D Unspecified nondisplaced fracture of third cervical vertebra, subsequent encounter for fracture with routine healing: Secondary | ICD-10-CM | POA: Diagnosis not present

## 2021-03-28 DIAGNOSIS — E039 Hypothyroidism, unspecified: Secondary | ICD-10-CM

## 2021-03-28 DIAGNOSIS — R7301 Impaired fasting glucose: Secondary | ICD-10-CM

## 2021-03-28 DIAGNOSIS — S129XXA Fracture of neck, unspecified, initial encounter: Secondary | ICD-10-CM | POA: Insufficient documentation

## 2021-03-28 DIAGNOSIS — E559 Vitamin D deficiency, unspecified: Secondary | ICD-10-CM

## 2021-03-28 DIAGNOSIS — E7800 Pure hypercholesterolemia, unspecified: Secondary | ICD-10-CM

## 2021-03-28 NOTE — Assessment & Plan Note (Addendum)
-  C3 fx on x-ray -referred to ortho who referred her to neurosurgery -she states she will wear c-collar while driving, but should heal well... but will have arthritis

## 2021-03-28 NOTE — Assessment & Plan Note (Addendum)
-  Per 03/07/21 DEXA scan, "The BMD measured at Femur Total Left is 0.743 g/cm2 with a T-score of -2.1" -however she has recent cervical vertebrae fracture -will consider alendronate vs prolia at next visit

## 2021-03-28 NOTE — Patient Instructions (Addendum)
Please have fasting labs drawn 2-3 days prior to your appointment so we can discuss the results during your office visit.  You will meet with Dr. Lodema Hong at your next visit so you will have a female provider for your PAP exam.

## 2021-03-28 NOTE — Assessment & Plan Note (Signed)
Last vitamin D Lab Results  Component Value Date   VD25OH 38.9 03/06/2021   -continue current supplementation

## 2021-03-28 NOTE — Assessment & Plan Note (Signed)
Lab Results  Component Value Date   TSH 2.390 03/06/2021   -thyroid labs look great -continue 50 mcg levothyroxine

## 2021-03-28 NOTE — Assessment & Plan Note (Addendum)
-  has psych referral -requesting medication changes; defer to psych

## 2021-03-28 NOTE — Progress Notes (Signed)
Established Patient Office Visit  Subjective:  Patient ID: Anita Berry, female    DOB: Dec 28, 1960  Age: 60 y.o. MRN: [id number]  CC:  Chief Complaint  Patient presents with   Annual Exam    CPE    HPI Anita Berry presents for physical exam. At her new patient appt on 03/05/21, she had a neck fracture. She was referred to ortho, who referred her to neurosurgery.  She is seeing psych for bipolar, and she would like to make some med changes.  Past Medical History:  Diagnosis Date   Bipolar 1 disorder ([place])    Bipolar disorder, unspecified ([place])    Dementia ([place])    Thyroid disease     Past Surgical History:  Procedure Laterality Date   CHOLECYSTECTOMY     SHOULDER SURGERY Right    TONSILLECTOMY      History reviewed. No pertinent family history.  Social History   Socioeconomic History   Marital status: Single    Spouse name: Not on file   Number of children: 0   Years of education: Not on file   Highest education level: Not on file  Occupational History   Occupation: Retired    Comment: Scientist, clinical (histocompatibility and immunogenetics)- consulting  Tobacco Use   Smoking status: Never   Smokeless tobacco: Never  Vaping Use   Vaping Use: Never used  Substance and Sexual Activity   Alcohol use: Yes    Comment: none recently   Drug use: Never   Sexual activity: Not Currently  Other Topics Concern   Not on file  Social History Narrative   Never been married; boyfriend when she was 14 committed suicide, and she hasn't had solid relationship since   Social Determinants of Health   Financial Resource Strain: Not on file  Food Insecurity: Not on file  Transportation Needs: Not on file  Physical Activity: Not on file  Stress: Not on file  Social Connections: Not on file  Intimate Partner Violence: Not on file    Outpatient Medications Prior to Visit  Medication Sig Dispense Refill   donepezil (ARICEPT) 5 MG tablet Take 5 mg by mouth daily.     levothyroxine (SYNTHROID) 50 MCG tablet  levothyroxine 50 mcg tablet     traZODone (DESYREL) 100 MG tablet Take 1 tablet (100 mg total) by mouth at bedtime. 30 tablet 1   venlafaxine (EFFEXOR) 75 MG tablet Take 1 tablet (75 mg total) by mouth daily as needed. 30 tablet 0   Vitamin D, Ergocalciferol, (DRISDOL) 1.25 MG (50000 UNIT) CAPS capsule Take 50,000 Units by mouth 2 (two) times a week.     Elastic Bandages & Supports (CERVICAL COLLAR ADJUSTABLE) MISC Use c-collar during the day until cleared by orthopedics. (Patient not taking: Reported on 03/28/2021) 1 each 0   No facility-administered medications prior to visit.    Allergies  Allergen Reactions   Sulfa Antibiotics Swelling    ROS Review of Systems  Constitutional: Negative.   HENT: Negative.    Eyes: Negative.   Respiratory: Negative.    Cardiovascular: Negative.   Gastrointestinal: Negative.   Endocrine: Negative.   Genitourinary: Negative.   Musculoskeletal: Negative.   Skin: Negative.   Allergic/Immunologic: Negative.   Neurological: Negative.   Hematological: Negative.   Psychiatric/Behavioral: Negative.       Objective:    Physical Exam Constitutional:      Appearance: Normal appearance.  HENT:     Head: Normocephalic and atraumatic.     Right Ear: Tympanic membrane,  ear canal and external ear normal.     Left Ear: Tympanic membrane, ear canal and external ear normal.     Nose: Nose normal.     Mouth/Throat:     Mouth: Mucous membranes are moist.     Pharynx: Oropharynx is clear.  Eyes:     Extraocular Movements: Extraocular movements intact.     Conjunctiva/sclera: Conjunctivae normal.     Pupils: Pupils are equal, round, and reactive to light.  Neck:     Comments: Non-tender enlarged salivary glands bilaterally (pt states that his is her baseline and they have been enlarged for years) Cardiovascular:     Rate and Rhythm: Normal rate and regular rhythm.     Pulses: Normal pulses.     Heart sounds: Normal heart sounds.  Pulmonary:      Effort: Pulmonary effort is normal.     Breath sounds: Normal breath sounds.  Abdominal:     General: Abdomen is flat. Bowel sounds are normal.     Palpations: Abdomen is soft.     Comments: RUQ surgical scar  Musculoskeletal:        General: Normal range of motion.     Cervical back: Normal range of motion and neck supple.  Skin:    General: Skin is warm and dry.     Capillary Refill: Capillary refill takes less than 2 seconds.  Neurological:     General: No focal deficit present.     Mental Status: She is alert and oriented to person, place, and time.     Cranial Nerves: No cranial nerve deficit.     Sensory: No sensory deficit.     Motor: No weakness.     Coordination: Coordination normal.     Gait: Gait normal.  Psychiatric:        Mood and Affect: Mood normal.        Behavior: Behavior normal.        Thought Content: Thought content normal.        Judgment: Judgment normal.    BP 96/69 (BP Location: Left Arm, Patient Position: Sitting, Cuff Size: Normal)   Pulse 73   Temp (!) 97.4 F (36.3 C) (Temporal)   Ht _0  (1.676 m)   Wt 169 lb (76.7 kg)   SpO2 97%   BMI 27.28 kg/m  Wt Readings from Last 3 Encounters:  03/28/21 169 lb (76.7 kg)  03/20/21 169 lb (76.7 kg)  03/05/21 171 lb (77.6 kg)     Health Maintenance Due  Topic Date Due   TETANUS/TDAP  Never done   PAP SMEAR-Modifier  Never done   COLONOSCOPY (Pts 45-56yr Insurance coverage will need to be confirmed)  Never done   Zoster Vaccines- Shingrix (1 of 2) Never done   COVID-19 Vaccine (4 - Booster for Moderna series) 10/17/2020   INFLUENZA VACCINE  03/26/2021    There are no preventive care reminders to display for this patient.  Lab Results  Component Value Date   TSH 2.390 03/06/2021   Lab Results  Component Value Date   WBC 5.0 03/06/2021   HGB 13.3 03/06/2021   HCT 40.1 03/06/2021   MCV 93 03/06/2021   PLT 216 03/06/2021   Lab Results  Component Value Date   NA 141 03/06/2021   K 3.8  03/06/2021   CO2 20 03/06/2021   GLUCOSE 104 (H) 03/06/2021   BUN 18 03/06/2021   CREATININE 1.19 (H) 03/06/2021   BILITOT 0.4 03/06/2021   ALKPHOS 173 (  H) 03/06/2021   AST 24 03/06/2021   ALT 15 03/06/2021   PROT 6.3 03/06/2021   ALBUMIN 4.4 03/06/2021   CALCIUM 9.1 03/06/2021   EGFR 52 (L) 03/06/2021   Lab Results  Component Value Date   CHOL 202 (H) 03/06/2021   Lab Results  Component Value Date   HDL 117 03/06/2021   Lab Results  Component Value Date   LDLCALC 75 03/06/2021   Lab Results  Component Value Date   TRIG 51 03/06/2021   No results found for: [facility] Lab Results  Component Value Date   HGBA1C 5.5 03/06/2021      Assessment & Plan:   Problem List Items Addressed This Visit       Endocrine   Hypothyroidism    Lab Results  Component Value Date   TSH 2.390 03/06/2021  -thyroid labs look great -continue 50 mcg levothyroxine      Relevant Orders   TSH + free T4     Musculoskeletal and Integument   Osteoporosis    -Per 03/07/21 DEXA scan, "The BMD measured at Femur Total Left is 0.743 g/cm2 with a T-score of -2.1" -however she has recent cervical vertebrae fracture -will consider alendronate vs prolia at next visit       Relevant Orders   CBC with Differential/Platelet   CMP14+EGFR   Fracture, cervical vertebra (HCC)    -C3 fx on x-ray -referred to ortho who referred her to neurosurgery -she states she will wear c-collar while driving, but should heal well... but will have arthritis       Relevant Orders   CBC with Differential/Platelet   CMP14+EGFR     Other   Encounter for general adult medical examination with abnormal findings - Primary   Bipolar disorder ([place])    -has psych referral -requesting medication changes; defer to psych       Vitamin D deficiency    Last vitamin D Lab Results  Component Value Date   VD25OH 38.9 03/06/2021  -continue current supplementation      Other Visit Diagnoses     IFG (impaired  fasting glucose)       Relevant Orders   Hemoglobin A1c   Pure hypercholesterolemia       Relevant Orders   Lipid Panel With LDL/HDL Ratio       No orders of the defined types were placed in this encounter.   Follow-up: Return in about 4 months (around 07/28/2021) for Lab follow-up (PAP exam and review colonoscopy records).    Noreene Larsson, NP

## 2021-04-04 ENCOUNTER — Other Ambulatory Visit: Payer: Self-pay

## 2021-04-04 ENCOUNTER — Other Ambulatory Visit: Payer: Self-pay | Admitting: Nurse Practitioner

## 2021-04-04 MED ORDER — VITAMIN D (ERGOCALCIFEROL) 1.25 MG (50000 UNIT) PO CAPS: 50000.0000 [IU] | ORAL_CAPSULE | ORAL | 0 refills | Status: DC

## 2021-04-19 ENCOUNTER — Telehealth (INDEPENDENT_AMBULATORY_CARE_PROVIDER_SITE_OTHER): Payer: Medicaid Other | Admitting: Licensed Clinical Social Worker

## 2021-04-19 ENCOUNTER — Other Ambulatory Visit: Payer: Self-pay

## 2021-04-19 DIAGNOSIS — F3131 Bipolar disorder, current episode depressed, mild: Secondary | ICD-10-CM

## 2021-04-19 NOTE — Progress Notes (Signed)
No Show; Patient called stating that she did not feel up to "doing this" today.

## 2021-05-02 ENCOUNTER — Telehealth (INDEPENDENT_AMBULATORY_CARE_PROVIDER_SITE_OTHER): Payer: Medicaid Other | Admitting: Licensed Clinical Social Worker

## 2021-05-02 ENCOUNTER — Other Ambulatory Visit: Payer: Self-pay

## 2021-05-02 DIAGNOSIS — F3131 Bipolar disorder, current episode depressed, mild: Secondary | ICD-10-CM | POA: Diagnosis not present

## 2021-05-04 NOTE — BH Specialist Note (Signed)
[place] [facility] Follow Up Assessment  MRN: [id number] NAME: Anita Berry Date: 05/04/21  Start time: 1230 End time: 1250 Total time: 20  Type of Contact: Follow up Call  Current concerns/stressors: mood instability  Screens/Assessment Tools: Depression screen [facility] 2/9 03/28/2021 03/21/2021 03/05/2021  Decreased Interest 3 3 0  Down, Depressed, Hopeless 3 3 3   PHQ - 2 Score 6 6 3   Altered sleeping 3 3 3   Tired, decreased energy 0 2 0  Change in appetite 1 2 3   Feeling bad or failure about yourself  3 1 3   Trouble concentrating 2 2 3   Moving slowly or fidgety/restless 2 1 0  Suicidal thoughts 0 0 0  PHQ-9 Score 17 17 15   Difficult doing work/chores Extremely dIfficult Somewhat difficult Very difficult    Functional Assessment:  Sleep: poor Appetite: poor Coping ability: overwhelmed Patient taking medications as prescribed:  yes  Current medications:  Outpatient Encounter Medications as of 05/02/2021  Medication Sig   donepezil (ARICEPT) 5 MG tablet Take 5 mg by mouth daily.   levothyroxine (SYNTHROID) 50 MCG tablet levothyroxine 50 mcg tablet   traZODone (DESYREL) 100 MG tablet Take 1 tablet (100 mg total) by mouth at bedtime.   venlafaxine (EFFEXOR) 75 MG tablet TAKE 1 TABLET BY MOUTH ONCE DAILY AS NEEDED   Vitamin D, Ergocalciferol, (DRISDOL) 1.25 MG (50000 UNIT) CAPS capsule Take 1 capsule (50,000 Units total) by mouth 2 (two) times a week.   No facility-administered encounter medications on file as of 05/02/2021.    Self-harm and/or Suicidal Behaviors Risk Assessment Self-harm risk factors: no Patient endorses recent self injurious thoughts and/or behaviors: No   Suicide ideations: No plan to harm self or others   Danger to Others Risk Assessment Danger to others risk factors: no Patient endorses recent thoughts of harming others: No    Substance Use Assessment Patient recently consumed alcohol: No  Patient recently used drugs: No  Patient is concerned about  dependence or abuse of substances: No    Goals, Interventions and Follow-up Plan Goals: Increase healthy adjustment to current life circumstances Interventions: Mindfulness or Relaxation Training   Summary of Clinical Assessment  [name] is a 60 yr old woman present for follow up. Dajai reports that she is feeling better since the previous session.  She states that at times she has poor mood for about 2-3 weeks. LCSW offered education about common irrational fears and beliefs that contribute to anxiety. Reviewed and showed client how to use CBT/REBT strategies to recognize and re-frame irrational fears and self-talk as a means of increasing client's capacity to handle their anxiety more constructively.    Follow-up Plan:  vbh session biweekly , LCSW

## 2021-05-07 ENCOUNTER — Other Ambulatory Visit: Payer: Self-pay | Admitting: Nurse Practitioner

## 2021-06-03 ENCOUNTER — Other Ambulatory Visit: Payer: Self-pay | Admitting: Nurse Practitioner

## 2021-07-03 ENCOUNTER — Other Ambulatory Visit: Payer: Self-pay | Admitting: Nurse Practitioner

## 2021-07-03 NOTE — Progress Notes (Addendum)
Patient: Home  Provider: Home office Telephone visit                     Virtual Visit via Telephone Note   I connected with Anita Berry on 05/02/21 by telephone and verified that I am speaking with the correct person using two identifiers.

## 2021-07-31 ENCOUNTER — Other Ambulatory Visit: Payer: Self-pay | Admitting: Nurse Practitioner

## 2021-08-01 DIAGNOSIS — Z139 Encounter for screening, unspecified: Secondary | ICD-10-CM | POA: Diagnosis not present

## 2021-08-01 DIAGNOSIS — E039 Hypothyroidism, unspecified: Secondary | ICD-10-CM | POA: Diagnosis not present

## 2021-08-01 DIAGNOSIS — M8000XD Age-related osteoporosis with current pathological fracture, unspecified site, subsequent encounter for fracture with routine healing: Secondary | ICD-10-CM | POA: Diagnosis not present

## 2021-08-01 DIAGNOSIS — E7800 Pure hypercholesterolemia, unspecified: Secondary | ICD-10-CM | POA: Diagnosis not present

## 2021-08-01 DIAGNOSIS — R7301 Impaired fasting glucose: Secondary | ICD-10-CM | POA: Diagnosis not present

## 2021-08-01 DIAGNOSIS — S12201D Unspecified nondisplaced fracture of third cervical vertebra, subsequent encounter for fracture with routine healing: Secondary | ICD-10-CM | POA: Diagnosis not present

## 2021-08-02 LAB — CMP14+EGFR
ALT: 13 IU/L (ref 0–32)
AST: 20 IU/L (ref 0–40)
Albumin/Globulin Ratio: 2.6 — ABNORMAL HIGH (ref 1.2–2.2)
Albumin: 4.5 g/dL (ref 3.8–4.8)
Alkaline Phosphatase: 143 IU/L — ABNORMAL HIGH (ref 44–121)
BUN/Creatinine Ratio: 15 (ref 12–28)
BUN: 18 mg/dL (ref 8–27)
Bilirubin Total: 0.5 mg/dL (ref 0.0–1.2)
CO2: 19 mmol/L — ABNORMAL LOW (ref 20–29)
Calcium: 8.9 mg/dL (ref 8.7–10.3)
Chloride: 108 mmol/L — ABNORMAL HIGH (ref 96–106)
Creatinine, Ser: 1.2 mg/dL — ABNORMAL HIGH (ref 0.57–1.00)
Globulin, Total: 1.7 g/dL (ref 1.5–4.5)
Glucose: 90 mg/dL (ref 70–99)
Potassium: 4.2 mmol/L (ref 3.5–5.2)
Sodium: 144 mmol/L (ref 134–144)
Total Protein: 6.2 g/dL (ref 6.0–8.5)
eGFR: 51 mL/min/{1.73_m2} — ABNORMAL LOW (ref >59)

## 2021-08-02 LAB — CBC WITH DIFFERENTIAL/PLATELET
Basophils Absolute: 0 10*3/uL (ref 0.0–0.2)
Basos: 0 %
EOS (ABSOLUTE): 0.1 10*3/uL (ref 0.0–0.4)
Eos: 1 %
Hematocrit: 37.8 % (ref 34.0–46.6)
Hemoglobin: 13 g/dL (ref 11.1–15.9)
Immature Grans (Abs): 0 10*3/uL (ref 0.0–0.1)
Immature Granulocytes: 0 %
Lymphocytes Absolute: 0.9 10*3/uL (ref 0.7–3.1)
Lymphs: 17 %
MCH: 31.6 pg (ref 26.6–33.0)
MCHC: 34.4 g/dL (ref 31.5–35.7)
MCV: 92 fL (ref 79–97)
Monocytes Absolute: 0.2 10*3/uL (ref 0.1–0.9)
Monocytes: 4 %
Neutrophils Absolute: 3.9 10*3/uL (ref 1.4–7.0)
Neutrophils: 78 %
Platelets: 206 10*3/uL (ref 150–450)
RBC: 4.11 x10E6/uL (ref 3.77–5.28)
RDW: 12.2 % (ref 11.7–15.4)
WBC: 5.1 10*3/uL (ref 3.4–10.8)

## 2021-08-02 LAB — HIV ANTIBODY (ROUTINE TESTING W REFLEX): HIV Screen 4th Generation wRfx: NONREACTIVE

## 2021-08-02 LAB — LIPID PANEL WITH LDL/HDL RATIO
Cholesterol, Total: 189 mg/dL (ref 100–199)
HDL: 107 mg/dL (ref >39)
LDL Chol Calc (NIH): 72 mg/dL (ref 0–99)
LDL/HDL Ratio: 0.7 ratio (ref 0.0–3.2)
Triglycerides: 54 mg/dL (ref 0–149)
VLDL Cholesterol Cal: 10 mg/dL (ref 5–40)

## 2021-08-02 LAB — HEMOGLOBIN A1C
Est. average glucose Bld gHb Est-mCnc: 105 mg/dL
Hgb A1c MFr Bld: 5.3 % (ref 4.8–5.6)

## 2021-08-02 LAB — HEPATITIS C ANTIBODY: Hep C Virus Ab: <0.1 s/co ratio (ref 0.0–0.9)

## 2021-08-02 LAB — TSH+FREE T4
Free T4: 1.2 ng/dL (ref 0.82–1.77)
TSH: 1.61 u[IU]/mL (ref 0.450–4.500)

## 2021-08-02 NOTE — Progress Notes (Signed)
HIV and HCV screening were negative.

## 2021-08-02 NOTE — Progress Notes (Signed)
Labs are stable. AP has improved. You see her on 12/16.

## 2021-08-10 ENCOUNTER — Ambulatory Visit (INDEPENDENT_AMBULATORY_CARE_PROVIDER_SITE_OTHER): Payer: Medicaid Other | Admitting: Nurse Practitioner

## 2021-08-10 ENCOUNTER — Encounter: Payer: Self-pay | Admitting: Nurse Practitioner

## 2021-08-10 ENCOUNTER — Other Ambulatory Visit: Payer: Self-pay

## 2021-08-10 VITALS — BP 130/79 | HR 73 | Ht 66.0 in | Wt 174.0 lb

## 2021-08-10 DIAGNOSIS — F3131 Bipolar disorder, current episode depressed, mild: Secondary | ICD-10-CM | POA: Diagnosis not present

## 2021-08-10 DIAGNOSIS — E039 Hypothyroidism, unspecified: Secondary | ICD-10-CM

## 2021-08-10 DIAGNOSIS — E559 Vitamin D deficiency, unspecified: Secondary | ICD-10-CM

## 2021-08-10 DIAGNOSIS — Z1212 Encounter for screening for malignant neoplasm of rectum: Secondary | ICD-10-CM

## 2021-08-10 DIAGNOSIS — Z1211 Encounter for screening for malignant neoplasm of colon: Secondary | ICD-10-CM | POA: Diagnosis not present

## 2021-08-10 DIAGNOSIS — K219 Gastro-esophageal reflux disease without esophagitis: Secondary | ICD-10-CM | POA: Diagnosis not present

## 2021-08-10 MED ORDER — OMEPRAZOLE MAGNESIUM 20 MG PO TBEC: 20.0000 mg | DELAYED_RELEASE_TABLET | Freq: Every day | ORAL | 0 refills | Status: DC

## 2021-08-10 MED ORDER — VITAMIN D3 25 MCG (1000 UT) PO CAPS
1000.0000 [IU] | ORAL_CAPSULE | Freq: Every day | ORAL | 3 refills | Status: DC
Start: 2021-08-10 — End: 2023-12-26

## 2021-08-10 MED ORDER — VENLAFAXINE HCL 75 MG PO TABS: 75.0000 mg | ORAL_TABLET | Freq: Every day | ORAL | 0 refills | Status: DC

## 2021-08-10 NOTE — Assessment & Plan Note (Signed)
Continue synthroid 50 mcg daily 

## 2021-08-10 NOTE — Patient Instructions (Addendum)
Please get your TDAP vaccine at your pharmacy.  Referral sent to gastroenterologist for coloscopy.   LABWORK  NEEDS TO BE DONE BETWEEN 3 TO 7 DAYS BEFORE YOUR NEXT SCEDULED  VISIT.  THIS WILL IMPROVE THE QUALITY OF YOUR CARE.   It is important that you exercise regularly at least 30 minutes 5 times a week.  Think about what you will eat, plan ahead. Choose " clean, green, fresh or frozen" over canned, processed or packaged foods which are more sugary, salty and fatty. 70 to 75% of food eaten should be vegetables and fruit. Three meals at set times with snacks allowed between meals, but they must be fruit or vegetables. Aim to eat over a 12 hour period , example 7 am to 7 pm, and STOP after  your last meal of the day. Drink water,generally about 64 ounces per day, no other drink is as healthy. Fruit juice is best enjoyed in a healthy way, by EATING the fruit.  Thanks for choosing [facility], we consider it a privelige to serve you.

## 2021-08-10 NOTE — Assessment & Plan Note (Signed)
Effexor refilled today.

## 2021-08-10 NOTE — Progress Notes (Signed)
° °  Anita Berry     MRN: [id number]      DOB: Dec 26, 1960   HPI Anita Berry is here for follow up and re-evaluation of chronic medical conditions, medication management and review of any available recent lab and radiology data.  Preventive health is updated, specifically  Cancer screening and Immunization.   Questions or concerns regarding consultations or procedures which the PT has had in the interim are  addressed. The PT denies any adverse reactions to current medications since the last visit.   Pt c/o blisters like sores in and outside her nose, symptoms first started years ago. She currently denies sore in and around her nose, she uses cbd cream that she put on her horse, the cream helps clear the sores.  ROS Denies recent fever or chills. Denies sinus pressure, nasal congestion, ear pain or sore throat. Denies chest congestion, productive cough or wheezing. Denies chest pains, palpitations and leg swelling Denies abdominal pain, nausea, vomiting,diarrhea or constipation.   Denies dysuria, frequency, hesitancy or incontinence. Denies joint pain, swelling and limitation in mobility. Denies headaches, seizures, numbness, or tingling. Denies depression, anxiety or insomnia. Denies skin break down or rash.   PE  BP 130/79 (BP Location: Left Arm, Patient Position: Sitting, Cuff Size: Normal)    Pulse 73    Ht 5\' 6"  (1.676 m)    Wt 174 lb (78.9 kg)    SpO2 98%    BMI 28.08 kg/m   Patient alert and oriented and in no cardiopulmonary distress.  HEENT: No facial asymmetry, EOMI,     Neck supple .  Chest: Clear to auscultation bilaterally.  CVS: S1, S2 no murmurs, no S3.Regular rate.  ABD: Soft non tender.   Ext: No edema  MS: Adequate ROM spine, shoulders, hips and knees.  Skin: Intact, no ulcerations or rash noted.  Psych: Good eye contact, normal affect. Memory intact not anxious or depressed appearing.  CNS: CN 2-12 intact, power,  normal throughout.no focal deficits  noted.   Assessment & Plan

## 2021-08-10 NOTE — Assessment & Plan Note (Addendum)
Takes vitamin D 50,000 units twice weekly. Start taking 1000 units daily.

## 2021-08-10 NOTE — Assessment & Plan Note (Signed)
Condition stable, omeprazole refilled.

## 2021-08-14 ENCOUNTER — Encounter: Payer: Self-pay | Admitting: Internal Medicine

## 2021-10-03 ENCOUNTER — Ambulatory Visit: Payer: Medicaid Other

## 2021-10-11 ENCOUNTER — Telehealth: Payer: Self-pay

## 2021-10-11 ENCOUNTER — Other Ambulatory Visit: Payer: Self-pay

## 2021-10-11 DIAGNOSIS — G4700 Insomnia, unspecified: Secondary | ICD-10-CM

## 2021-10-11 MED ORDER — TRAZODONE HCL 100 MG PO TABS: 100.0000 mg | ORAL_TABLET | Freq: Every day | ORAL | 1 refills | Status: DC

## 2021-10-11 NOTE — Telephone Encounter (Signed)
Medications sent to correct pharmacy 

## 2021-10-11 NOTE — Telephone Encounter (Signed)
Patient called said [facility] told patient this was sent into our office for prior authorization. Need this medicine before weekend, need it by the weekend.  Only has 2 tablets left.  traZODone (DESYREL) 100 MG tablet   Pharmacy [facility] [place]  Patient call back # [phone]

## 2021-10-12 IMAGING — MG MM DIGITAL SCREENING BILAT W/ TOMO AND CAD
6 of 12 series · 6 of 36 positions shown · non-contrast
Comparison: None.
COMPARISON: None.

Addendum:
CLINICAL DATA: Screening.

EXAM:
DIGITAL SCREENING BILATERAL MAMMOGRAM WITH TOMOSYNTHESIS AND CAD
TECHNIQUE: Bilateral screening digital craniocaudal and mediolateral oblique
mammograms were obtained. Bilateral screening digital breast
tomosynthesis was performed. The images were evaluated with
computer-aided detection.

[R MLO synth-2D]
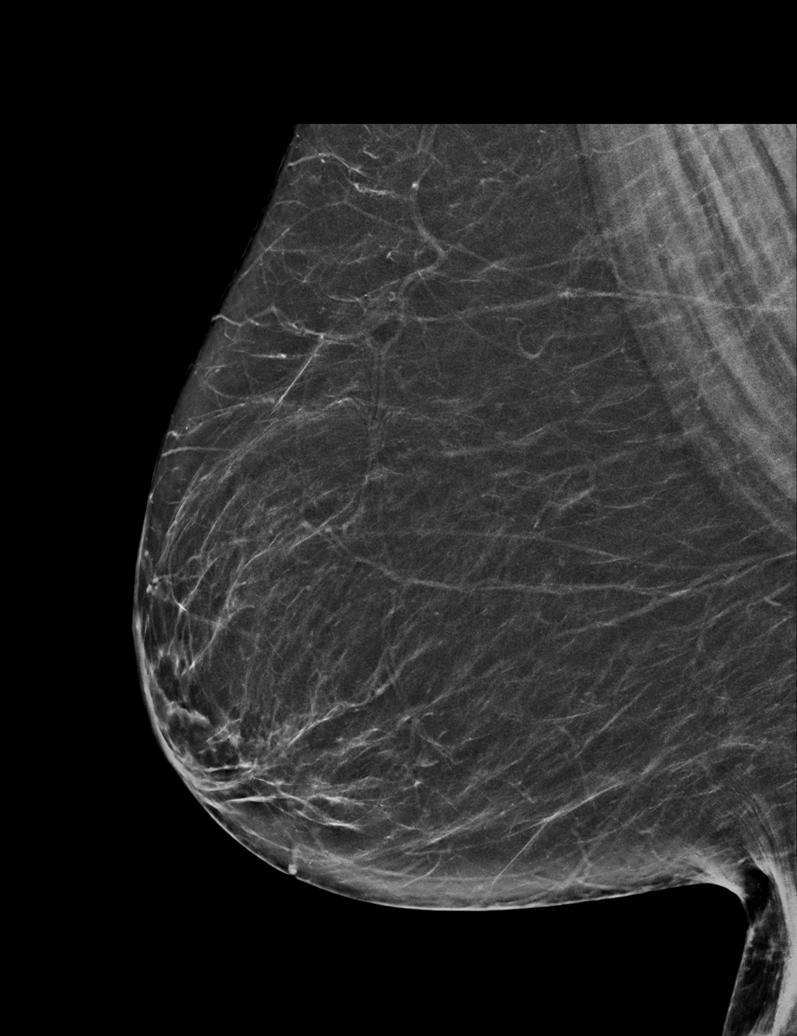

[L CC synth-2D (1 of 2)]
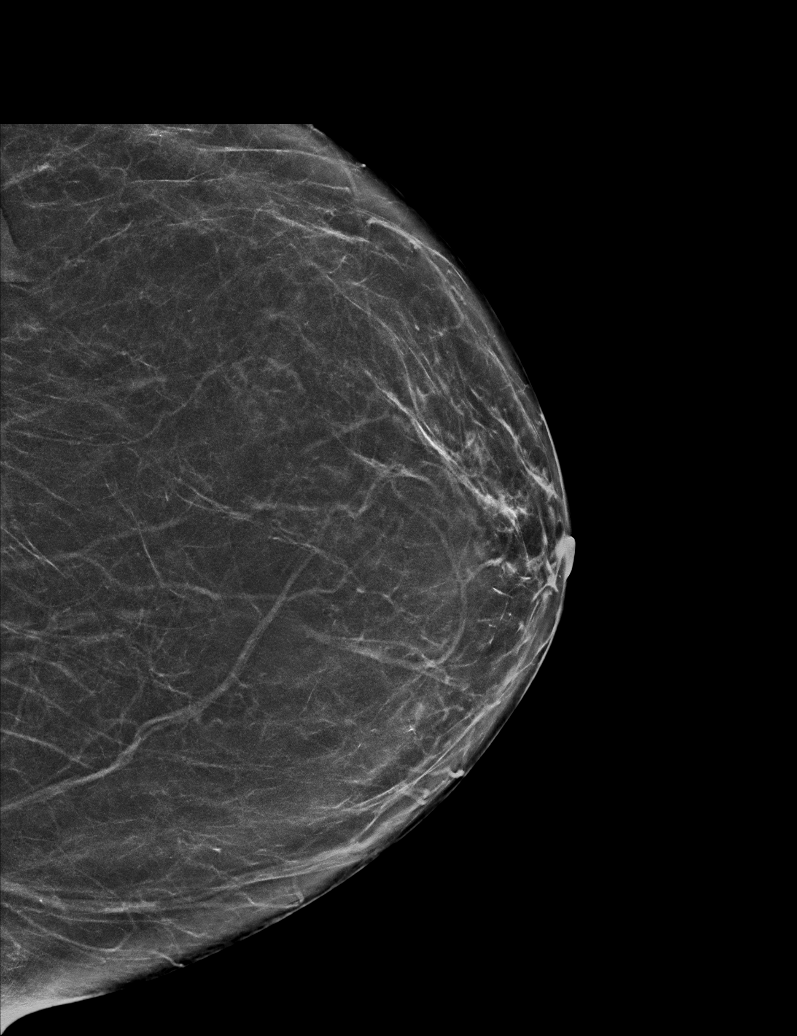

[L MLO synth-2D]
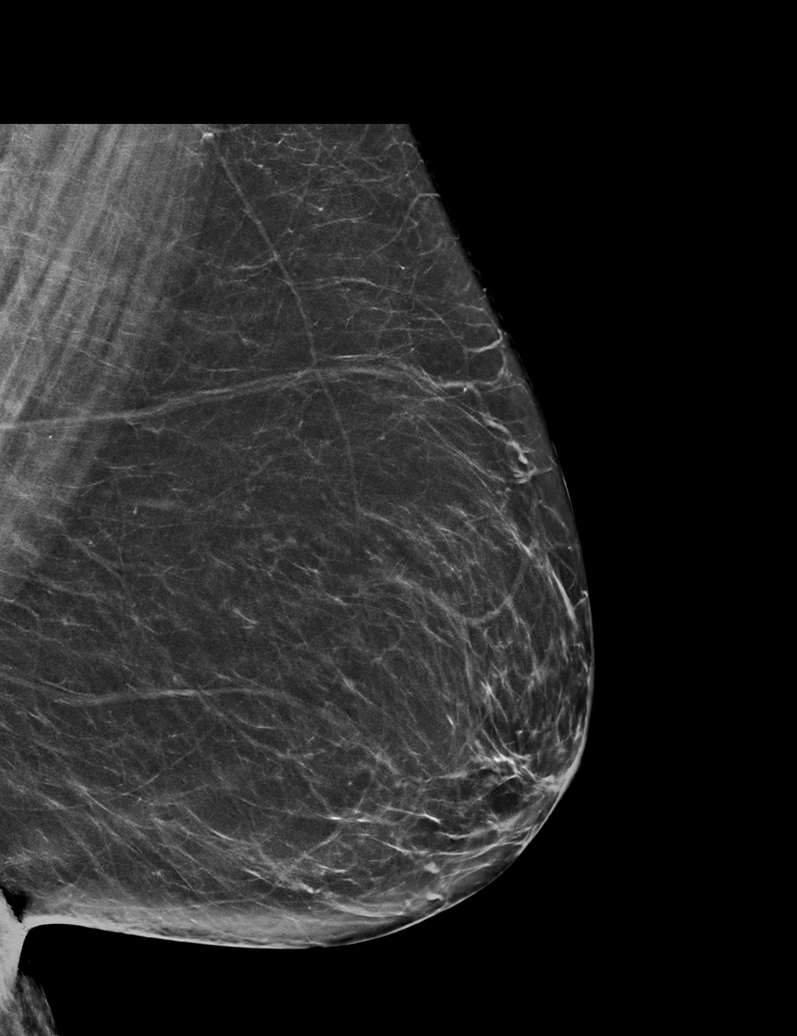

[L CC synth-2D (2 of 2)]
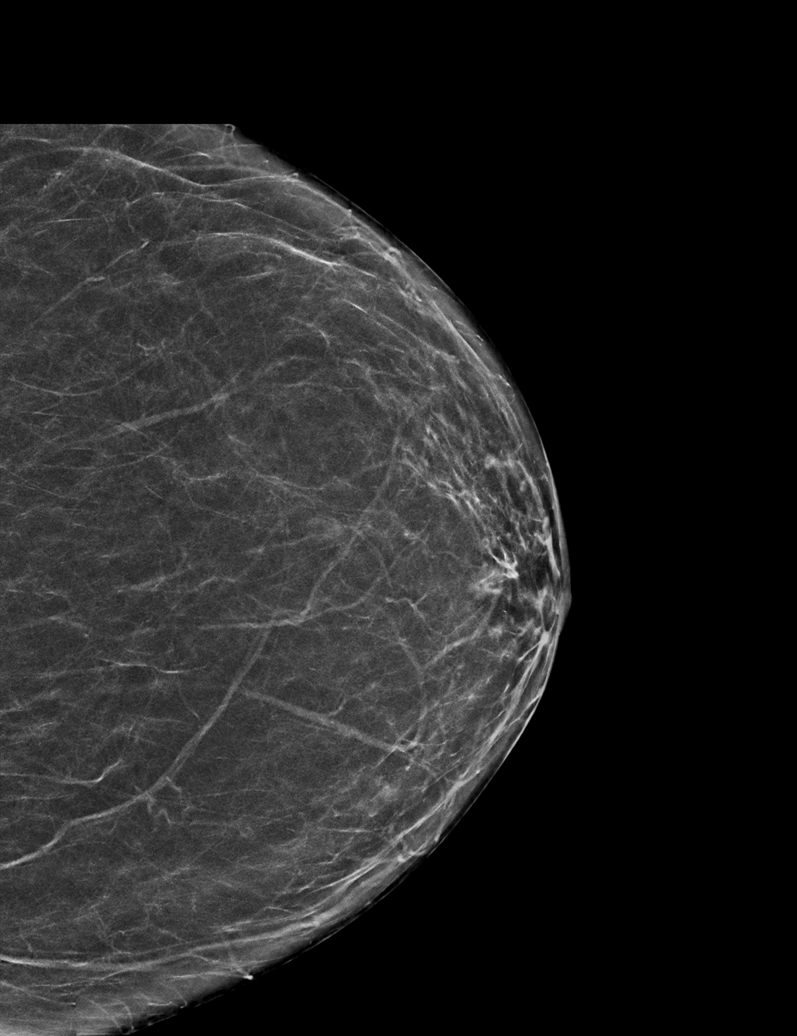

[R CC synth-2D (1 of 2)]
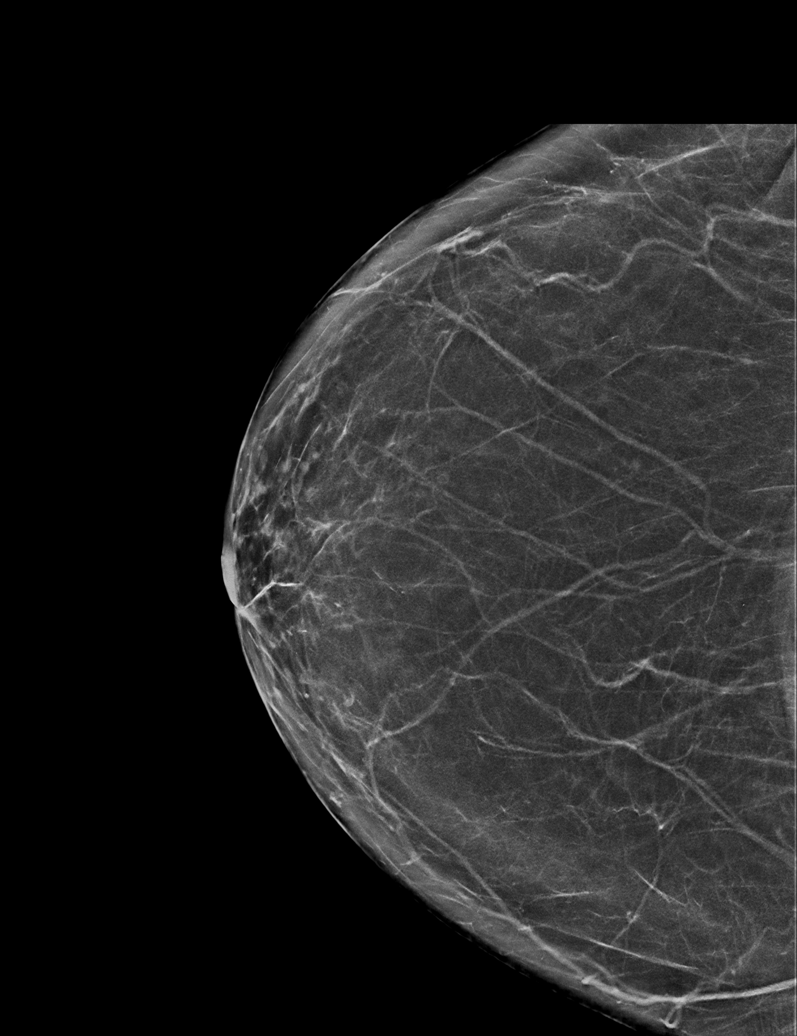

[R CC synth-2D (2 of 2)]
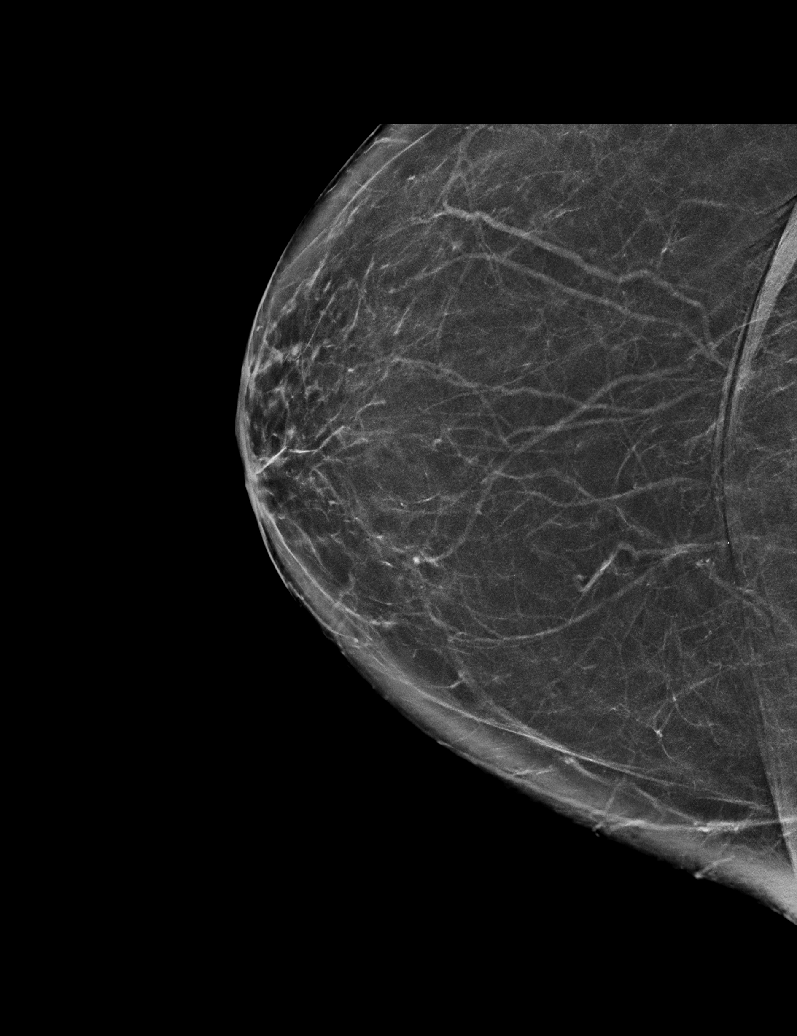

[6 of 36 positions shown; findings below may reference images not displayed]

Outside priors have been requested. If they are
obtained, an addendum will be issued.

ACR Breast Density Category a: The breast tissue is almost entirely
fatty.
FINDINGS: There are no findings suspicious for malignancy.
IMPRESSION: No mammographic evidence of malignancy. A result letter of this
screening mammogram will be mailed directly to the patient.

RECOMMENDATION:
Screening mammogram in one year. (Code:VX-V-Y0B)

BI-RADS CATEGORY  1: Negative.

ADDENDUM:
Stent priors dated December 30, 2016 and November 28, 2014 have been obtained.
There is no change to the findings in impression. No mammographic
evidence of malignancy bilaterally.

Recommendation: Screening mammogram in one year.(Code:VX-V-Y0B)

BI-RADS: 1: Negative.

*** End of Addendum ***
Outside priors have been requested. If they are
obtained, an addendum will be issued.

ACR Breast Density Category a: The breast tissue is almost entirely
fatty.
FINDINGS: There are no findings suspicious for malignancy.
IMPRESSION: No mammographic evidence of malignancy. A result letter of this
screening mammogram will be mailed directly to the patient.

RECOMMENDATION:
Screening mammogram in one year. (Code:VX-V-Y0B)

BI-RADS CATEGORY  1: Negative.

## 2021-11-08 ENCOUNTER — Other Ambulatory Visit: Payer: Self-pay | Admitting: Nurse Practitioner

## 2021-11-23 ENCOUNTER — Telehealth: Payer: Self-pay | Admitting: Nurse Practitioner

## 2021-11-23 NOTE — Telephone Encounter (Signed)
Patient needs refill on  ? ?levothyroxine (SYNTHROID) 50 MCG tablet  ? ? ?[facility] [place] ?

## 2021-11-26 ENCOUNTER — Other Ambulatory Visit: Payer: Self-pay

## 2021-11-26 ENCOUNTER — Telehealth: Payer: Self-pay

## 2021-11-26 MED ORDER — LEVOTHYROXINE SODIUM 50 MCG PO TABS: ORAL_TABLET | ORAL | 1 refills | Status: DC

## 2021-11-26 NOTE — Telephone Encounter (Signed)
Please call with instructions for med called in without instructions ?

## 2021-11-26 NOTE — Telephone Encounter (Signed)
Rx sent 

## 2021-11-27 ENCOUNTER — Other Ambulatory Visit: Payer: Self-pay | Admitting: Nurse Practitioner

## 2021-11-27 DIAGNOSIS — F3131 Bipolar disorder, current episode depressed, mild: Secondary | ICD-10-CM

## 2021-11-27 NOTE — Telephone Encounter (Signed)
Resent rx

## 2021-12-10 ENCOUNTER — Other Ambulatory Visit: Payer: Self-pay | Admitting: Nurse Practitioner

## 2021-12-10 DIAGNOSIS — G4700 Insomnia, unspecified: Secondary | ICD-10-CM

## 2021-12-14 ENCOUNTER — Other Ambulatory Visit: Payer: Self-pay | Admitting: Nurse Practitioner

## 2021-12-14 ENCOUNTER — Telehealth: Payer: Self-pay

## 2021-12-14 DIAGNOSIS — G4700 Insomnia, unspecified: Secondary | ICD-10-CM

## 2021-12-14 MED ORDER — DONEPEZIL HCL 5 MG PO TABS: 5.0000 mg | ORAL_TABLET | Freq: Every day | ORAL | 1 refills | Status: DC

## 2021-12-14 MED ORDER — TRAZODONE HCL 100 MG PO TABS: 100.0000 mg | ORAL_TABLET | Freq: Every day | ORAL | 0 refills | Status: DC

## 2021-12-14 NOTE — Telephone Encounter (Signed)
Patient called having issues with prescriptions does not understand why her prescriptions are sent in different times.   ? ?Can the prescriptions be sent in all together as a 90 day supply, saying none of these medicines are not controlled.  Says no refill.  ? ?Can not afford driving back and afford [facility] wasting gas. ? ?[facility] [place] ? ?GOING FORWARD PLEASE SEND ALL TOGETHER WITH 90 DAY SUPPLY ?

## 2021-12-14 NOTE — Telephone Encounter (Signed)
Please advise 

## 2022-01-03 ENCOUNTER — Other Ambulatory Visit: Payer: Self-pay | Admitting: Nurse Practitioner

## 2022-01-03 DIAGNOSIS — E039 Hypothyroidism, unspecified: Secondary | ICD-10-CM

## 2022-01-03 LAB — CMP14+EGFR
ALT: 12 IU/L (ref 0–32)
AST: 20 IU/L (ref 0–40)
Albumin/Globulin Ratio: 2.2 (ref 1.2–2.2)
Albumin: 4.7 g/dL (ref 3.8–4.8)
Alkaline Phosphatase: 130 IU/L — ABNORMAL HIGH (ref 44–121)
BUN/Creatinine Ratio: 21 (ref 12–28)
BUN: 21 mg/dL (ref 8–27)
Bilirubin Total: 0.6 mg/dL (ref 0.0–1.2)
CO2: 19 mmol/L — ABNORMAL LOW (ref 20–29)
Calcium: 9.2 mg/dL (ref 8.7–10.3)
Chloride: 108 mmol/L — ABNORMAL HIGH (ref 96–106)
Creatinine, Ser: 1 mg/dL (ref 0.57–1.00)
Globulin, Total: 2.1 g/dL (ref 1.5–4.5)
Glucose: 84 mg/dL (ref 70–99)
Potassium: 4.1 mmol/L (ref 3.5–5.2)
Sodium: 142 mmol/L (ref 134–144)
Total Protein: 6.8 g/dL (ref 6.0–8.5)
eGFR: 64 mL/min/{1.73_m2} (ref >59)

## 2022-01-04 LAB — TSH: TSH: 2.06 u[IU]/mL (ref 0.450–4.500)

## 2022-01-04 LAB — SPECIMEN STATUS REPORT

## 2022-01-08 ENCOUNTER — Ambulatory Visit (INDEPENDENT_AMBULATORY_CARE_PROVIDER_SITE_OTHER): Payer: Medicaid Other | Admitting: Nurse Practitioner

## 2022-01-08 ENCOUNTER — Encounter: Payer: Self-pay | Admitting: Nurse Practitioner

## 2022-01-08 ENCOUNTER — Other Ambulatory Visit (HOSPITAL_COMMUNITY)
Admission: RE | Admit: 2022-01-08 | Discharge: 2022-01-08 | Disposition: A | Discharge status: Home / Self Care | Payer: Medicaid Other | Source: Ambulatory Visit | Attending: Nurse Practitioner | Admitting: Nurse Practitioner

## 2022-01-08 VITALS — BP 119/78 | HR 56 | Ht 66.0 in | Wt 159.0 lb

## 2022-01-08 DIAGNOSIS — Z124 Encounter for screening for malignant neoplasm of cervix: Secondary | ICD-10-CM | POA: Diagnosis not present

## 2022-01-08 DIAGNOSIS — E039 Hypothyroidism, unspecified: Secondary | ICD-10-CM

## 2022-01-08 DIAGNOSIS — L03211 Cellulitis of face: Secondary | ICD-10-CM | POA: Insufficient documentation

## 2022-01-08 DIAGNOSIS — F3131 Bipolar disorder, current episode depressed, mild: Secondary | ICD-10-CM

## 2022-01-08 DIAGNOSIS — E559 Vitamin D deficiency, unspecified: Secondary | ICD-10-CM

## 2022-01-08 DIAGNOSIS — R748 Abnormal levels of other serum enzymes: Secondary | ICD-10-CM | POA: Insufficient documentation

## 2022-01-08 DIAGNOSIS — Z1211 Encounter for screening for malignant neoplasm of colon: Secondary | ICD-10-CM

## 2022-01-08 DIAGNOSIS — M8580 Other specified disorders of bone density and structure, unspecified site: Secondary | ICD-10-CM | POA: Insufficient documentation

## 2022-01-08 DIAGNOSIS — M85852 Other specified disorders of bone density and structure, left thigh: Secondary | ICD-10-CM

## 2022-01-08 MED ORDER — CALCIUM CARB-CHOLECALCIFEROL 600-20 MG-MCG PO TABS: 600.0000 mg | ORAL_TABLET | Freq: Two times a day (BID) | ORAL | 3 refills | Status: DC

## 2022-01-08 MED ORDER — VENLAFAXINE HCL 75 MG PO TABS: 75.0000 mg | ORAL_TABLET | Freq: Two times a day (BID) | ORAL | 3 refills | Status: DC

## 2022-01-08 MED ORDER — CLINDAMYCIN HCL 300 MG PO CAPS: 300.0000 mg | ORAL_CAPSULE | Freq: Four times a day (QID) | ORAL | 0 refills | Status: AC

## 2022-01-08 NOTE — Patient Instructions (Addendum)
Pleas take clindamycin 300 mg four times daily for 7 days for  your cellulitis  ? ?Pleas start taking effexor 75mg  twice daily for your bipolar depression.  ? ?It is important that you exercise regularly at least 30 minutes 5 times a week.  ?Think about what you will eat, plan ahead. ?Choose " clean, green, fresh or frozen" over canned, processed or packaged foods which are more sugary, salty and fatty. ?70 to 75% of food eaten should be vegetables and fruit. ?Three meals at set times with snacks allowed between meals, but they must be fruit or vegetables. ?Aim to eat over a 12 hour period , example 7 am to 7 pm, and STOP after  your last meal of the day. ?Drink water,generally about 64 ounces per day, no other drink is as healthy. Fruit juice is best enjoyed in a healthy way, by EATING the fruit. ? ?Thanks for choosing [facility], we consider it a privelige to serve you. ? ?

## 2022-01-08 NOTE — Assessment & Plan Note (Signed)
Lab Results  ?Component Value Date  ? TSH 2.060 01/02/2022  ? ?Condition well-controlled on levothyroxine 50 mcg tablets daily continue current medication ?

## 2022-01-08 NOTE — Assessment & Plan Note (Addendum)
Site looks erythematous, tender, swelling ?Will treat with antibiotics ?Start clindamycin 300 mg 4 times daily for 7 days ?Warm compress encouraged ? ?

## 2022-01-08 NOTE — Assessment & Plan Note (Addendum)
PHQ-9 score 13 ?Chronic condition uncontrolled  ?Currently on Effexor 75 mg daily ?States that she was on Effexor 75 mg twice daily at 1 time ?Start Effexor 75 mg twice daily ?Denies SI, HI ?

## 2022-01-08 NOTE — Assessment & Plan Note (Addendum)
Currently not on vitamin D supplement ?Check vitamin D levels today ?Start Caltrate 600+ D3 for osteopenia ?

## 2022-01-08 NOTE — Assessment & Plan Note (Signed)
Lab Results  ?Component Value Date  ? ALKPHOS 130 (H) 01/02/2022  ?Labs trending down ?Patient told to avoid alcohol and Tylenol ?

## 2022-01-08 NOTE — Progress Notes (Signed)
? ?Established Patient Office Visit ? ?Subjective   ?Patient ID: Anita Berry, female    DOB: 07-29-1961  Age: 61 y.o. MRN: [id number] ? ?Chief Complaint  ?Patient presents with  ? Annual Exam  ?  cpe  ? Gynecologic Exam  ? ?Anita Berry with past medical history of GERD, hypothyroidism, dementia, osteopenia, vitamin D deficiency is here for Pap  smear and follow up and re-evaluation of chronic medical conditions, medication management and review of any available recent lab and radiology data.  ? ?States that she has last colon cancer screening in 2016, states that she had 2 shingles vaccine and TDAP vaccines at [facility], has had 4 covid vaccines.   ? ?Elevated liver enzymes, denies abdominal pain , denies current use of alcohol. States that her last drink was in January this year.  ? ?Pt complained of painful rash on her face .  Rash started started as a splinter under her skin, since the past 3 days , rash is painful, states that she has had similar rashes in the past and that after some days the rashes will burst open with pus and blood draining from it,  denies fever, chills.  ? ? ? ? ?HPI ? ? ? ?Review of Systems  ?Constitutional: Negative.   ?Respiratory: Negative.  Negative for cough, hemoptysis and sputum production.   ?Cardiovascular: Negative.  Negative for chest pain and palpitations.  ?Musculoskeletal: Negative.  Negative for back pain, falls, joint pain, myalgias and neck pain.  ?Skin:  Positive for rash. Negative for itching.  ?Psychiatric/Behavioral:  Positive for depression. Negative for hallucinations, substance abuse and suicidal ideas. The patient is not nervous/anxious and does not have insomnia.   ? ?  ?Objective:  ?  ? ?BP 119/78 (BP Location: Left Arm, Patient Position: Sitting, Cuff Size: Large)   Pulse (!) 56   Ht 5\' 6"  (1.676 m)   Wt 159 lb (72.1 kg)   SpO2 100%   BMI 25.66 kg/m?  ? ?  CMA present as chaperone ?Physical Exam ?Constitutional:   ?   General: She is not in acute  distress. ?   Appearance: She is obese. She is not ill-appearing or diaphoretic.  ?Cardiovascular:  ?   Rate and Rhythm: Normal rate and regular rhythm.  ?   Heart sounds: No murmur heard. ?  No friction rub. No gallop.  ?Pulmonary:  ?   Effort: Pulmonary effort is normal. No respiratory distress.  ?   Breath sounds: Normal breath sounds. No stridor. No wheezing, rhonchi or rales.  ?Chest:  ?   Chest wall: No mass, lacerations, deformity, swelling, tenderness, crepitus or edema.  ?Breasts: ?   Tanner Score is 5.  ?   Breasts are symmetrical.  ?   Right: Normal. No swelling, bleeding, inverted nipple, mass, nipple discharge, skin change or tenderness.  ?   Left: Normal. No swelling, bleeding, inverted nipple, mass, nipple discharge, skin change or tenderness.  ?Abdominal:  ?   Palpations: Abdomen is soft.  ?   Hernia: There is no hernia in the left inguinal area or right inguinal area.  ?Genitourinary: ?   Exam position: Lithotomy position.  ?   Pubic Area: No rash or pubic lice.   ?   Tanner stage (genital): 5.  ?   Labia:     ?   Right: No rash, tenderness, lesion or injury.     ?   Left: No rash, tenderness, lesion or injury.   ?  Urethra: No prolapse, urethral pain, urethral swelling or urethral lesion.  ?   Vagina: No signs of injury and foreign body. No vaginal discharge, erythema, tenderness, bleeding, lesions or prolapsed vaginal walls.  ?   Cervix: Dilated. No cervical motion tenderness, discharge, friability, lesion, erythema, cervical bleeding or eversion.  ?   Uterus: Not deviated, not enlarged, not fixed, not tender and no uterine prolapse.   ?   Adnexa:     ?   Right: No mass, tenderness or fullness.      ?   Left: No mass, tenderness or fullness.    ?   Rectum: No external hemorrhoid.  ?Musculoskeletal:  ?   Right lower leg: No edema.  ?   Left lower leg: No edema.  ?Lymphadenopathy:  ?   Upper Body:  ?   Right upper body: No supraclavicular, axillary or pectoral adenopathy.  ?   Left upper body: No  supraclavicular, axillary or pectoral adenopathy.  ?   Lower Body: No right inguinal adenopathy. No left inguinal adenopathy.  ?Skin: ?   Coloration: Skin is not jaundiced.  ?   Findings: Erythema and rash present. No bruising or lesion.  ?   Comments: Cellulitis noted on the right side of the face site is erythematous, mildly swellon, feels hard to touch, and tender  ?Neurological:  ?   Mental Status: She is alert.  ?Psychiatric:     ?   Mood and Affect: Mood normal.     ?   Behavior: Behavior normal.     ?   Thought Content: Thought content normal.     ?   Judgment: Judgment normal.  ? ? ? ?No results found for any visits on 01/08/22. ? ? ? ?The ASCVD Risk score (Arnett DK, et al., 2019) failed to calculate for the following reasons: ?  The valid HDL cholesterol range is 20 to 100 mg/dL ? ?  ?Assessment & Plan:  ? ?Problem List Items Addressed This Visit   ? ?  ? Endocrine  ? Hypothyroidism  ?  Lab Results  ?Component Value Date  ? TSH 2.060 01/02/2022  ?Condition well-controlled on levothyroxine 50 mcg tablets daily continue current medication ?  ?  ?  ? Musculoskeletal and Integument  ? Osteopenia  ?  Last DEXA scan was in July 2022 ?Currently not on calcium or vitamin D ?We will check vitamin D level today and start patient on Caltrate 600 mg twice daily ? ?  ?  ? Relevant Medications  ? Calcium Carb-Cholecalciferol (CALTRATE 600+D3) 600-20 MG-MCG TABS  ?  ? Other  ? Bipolar disorder (HCC)  ?  PHQ-9 score 13 ?Chronic condition uncontrolled  ?Currently on Effexor 75 mg daily ?States that she was on Effexor 75 mg twice daily at 1 time ?Start Effexor 75 mg twice daily ?Denies SI, HI ? ?  ?  ? Relevant Medications  ? venlafaxine (EFFEXOR) 75 MG tablet  ? Vitamin D deficiency  ?  Currently not on vitamin D supplement ?Check vitamin D levels today ?Start Caltrate 600+ D3 for osteopenia ? ?  ?  ? Relevant Orders  ? Vitamin D (25 hydroxy)  ? Screening for cervical cancer  ? Relevant Orders  ? Cytology - PAP  ?  Cellulitis of face - Primary  ?  Site looks erythematous, tender, swelling ?Will treat with antibiotics ?Start clindamycin 300 mg 4 times daily for 7 days ?Warm compress encouraged ? ? ?  ?  ? Relevant Medications  ?  clindamycin (CLEOCIN) 300 MG capsule  ? Elevated alkaline phosphatase level  ?  Lab Results  ?Component Value Date  ? ALKPHOS 130 (H) 01/02/2022  ?Labs trending down ?Patient told to avoid alcohol and Tylenol ?  ?  ? ?Other Visit Diagnoses   ? ? Screening for colon cancer      ? Relevant Orders  ? Cologuard  ? ?  ? ? ?Return in about 6 months (around 07/11/2022) for annual physical exam .  ? ? ?Donell BeersFolashade R Jaykwon Morones, FNP ? ?

## 2022-01-08 NOTE — Assessment & Plan Note (Signed)
Last DEXA scan was in July 2022 ?Currently not on calcium or vitamin D ?We will check vitamin D level today and start patient on Caltrate 600 mg twice daily ?

## 2022-01-09 ENCOUNTER — Other Ambulatory Visit: Payer: Self-pay | Admitting: Nurse Practitioner

## 2022-01-09 LAB — VITAMIN D 25 HYDROXY (VIT D DEFICIENCY, FRACTURES): Vit D, 25-Hydroxy: 23.4 ng/mL — ABNORMAL LOW (ref 30.0–100.0)

## 2022-01-09 NOTE — Progress Notes (Signed)
Patient has vitamin D deficiency and due to her osteopenia patient should start taking Caltrate 600+ D3 1 tablet by mouth 2 times daily.  She can get this medication over-the-counter

## 2022-01-11 LAB — CYTOLOGY - PAP
Comment: NEGATIVE
Diagnosis: NEGATIVE
High risk HPV: NEGATIVE

## 2022-01-11 NOTE — Progress Notes (Signed)
Normal Pap exam

## 2022-02-04 ENCOUNTER — Other Ambulatory Visit: Payer: Self-pay | Admitting: Nurse Practitioner

## 2022-02-04 DIAGNOSIS — K219 Gastro-esophageal reflux disease without esophagitis: Secondary | ICD-10-CM

## 2022-03-29 ENCOUNTER — Other Ambulatory Visit: Payer: Self-pay | Admitting: Nurse Practitioner

## 2022-03-29 DIAGNOSIS — G4700 Insomnia, unspecified: Secondary | ICD-10-CM

## 2022-04-28 ENCOUNTER — Other Ambulatory Visit: Payer: Self-pay | Admitting: Nurse Practitioner

## 2022-04-28 DIAGNOSIS — K219 Gastro-esophageal reflux disease without esophagitis: Secondary | ICD-10-CM

## 2022-04-29 ENCOUNTER — Other Ambulatory Visit: Payer: Self-pay | Admitting: Nurse Practitioner

## 2022-04-29 DIAGNOSIS — F3131 Bipolar disorder, current episode depressed, mild: Secondary | ICD-10-CM

## 2022-05-14 ENCOUNTER — Other Ambulatory Visit: Payer: Self-pay | Admitting: Nurse Practitioner

## 2022-05-31 ENCOUNTER — Other Ambulatory Visit: Payer: Self-pay | Admitting: Nurse Practitioner

## 2022-06-22 ENCOUNTER — Other Ambulatory Visit: Payer: Self-pay | Admitting: Nurse Practitioner

## 2022-06-22 DIAGNOSIS — G4700 Insomnia, unspecified: Secondary | ICD-10-CM

## 2022-07-11 ENCOUNTER — Encounter: Payer: Self-pay | Admitting: Family Medicine

## 2022-07-11 ENCOUNTER — Ambulatory Visit (INDEPENDENT_AMBULATORY_CARE_PROVIDER_SITE_OTHER): Payer: Medicaid Other | Admitting: Family Medicine

## 2022-07-11 ENCOUNTER — Ambulatory Visit: Payer: Medicaid Other | Admitting: Internal Medicine

## 2022-07-11 VITALS — BP 122/76 | HR 97 | Ht 66.0 in | Wt 174.0 lb

## 2022-07-11 DIAGNOSIS — E7849 Other hyperlipidemia: Secondary | ICD-10-CM

## 2022-07-11 DIAGNOSIS — G8929 Other chronic pain: Secondary | ICD-10-CM

## 2022-07-11 DIAGNOSIS — M79642 Pain in left hand: Secondary | ICD-10-CM

## 2022-07-11 DIAGNOSIS — E038 Other specified hypothyroidism: Secondary | ICD-10-CM

## 2022-07-11 DIAGNOSIS — E559 Vitamin D deficiency, unspecified: Secondary | ICD-10-CM | POA: Diagnosis not present

## 2022-07-11 DIAGNOSIS — F3131 Bipolar disorder, current episode depressed, mild: Secondary | ICD-10-CM

## 2022-07-11 DIAGNOSIS — R7301 Impaired fasting glucose: Secondary | ICD-10-CM

## 2022-07-11 DIAGNOSIS — R21 Rash and other nonspecific skin eruption: Secondary | ICD-10-CM

## 2022-07-11 DIAGNOSIS — L282 Other prurigo: Secondary | ICD-10-CM

## 2022-07-11 DIAGNOSIS — M85852 Other specified disorders of bone density and structure, left thigh: Secondary | ICD-10-CM

## 2022-07-11 MED ORDER — DOXEPIN HCL 5 % EX CREA: TOPICAL_CREAM | CUTANEOUS | 0 refills | Status: DC

## 2022-07-11 NOTE — Progress Notes (Signed)
Established Patient Office Visit  Subjective:  Patient ID: Anita Berry, female    DOB: [birth year]  Age: 61 y.o. MRN: [id number]  CC:  Chief Complaint  Patient presents with   Follow-up    Follow up, pt would like to discuss arthritis flare up on her left hand, has sore on her face since 03/26/2022 would like to have this looked at today.     HPI Anita Berry is a 61 y.o. female with past medical history of GERD, hypothyroidism, dementia, osteopenia, vitamin D deficiency  presents for f/u of  chronic medical conditions.  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  She complains of joint stiffness and inability to pick up objects due to her arthritis flareup.  She denies swelling and signs of inflammation.  She reports that she has taken over-the-counter ibuprofen and would like to start on steroid injections for relief of her symptoms.  She rates her pain 10 out of 10, and describes her pain as a constant sore, achy and dull pain.  Rash on the face: She notes a scab on her face that she reports was an abscess that has healed.  She complains of intense pruritus at the affected site.  Past Medical History:  Diagnosis Date   Bipolar 1 disorder ([place])    Bipolar disorder, unspecified ([place])    Dementia ([place])    Thyroid disease     Past Surgical History:  Procedure Laterality Date   CHOLECYSTECTOMY     SHOULDER SURGERY Right    TONSILLECTOMY      History reviewed. No pertinent family history.  Social History   Socioeconomic History   Marital status: Single    Spouse name: Not on file   Number of children: 0   Years of education: Not on file   Highest education level: Not on file  Occupational History   Occupation: Retired    Comment: Scientist, clinical (histocompatibility and immunogenetics)- consulting  Tobacco Use   Smoking status: Never   Smokeless tobacco: Never  Scientific laboratory technician  Use: Never used  Substance and Sexual Activity   Alcohol use: Not Currently    Comment: none recently   Drug use: Never   Sexual activity: Not Currently  Other Topics Concern   Not on file  Social History Narrative   Never been married; boyfriend when she was 14 committed suicide, and she hasn't had solid relationship since   Social Determinants of Health   Financial Resource Strain: Not on file  Food Insecurity: Not on file  Transportation Needs: Not on file  Physical Activity: Not on file  Stress: Not on file  Social Connections: Not on file  Intimate Partner Violence: Not on file    Outpatient Medications Prior to Visit  Medication Sig Dispense Refill   Calcium Carb-Cholecalciferol (CALTRATE 600+D3) 600-20 MG-MCG TABS Take 600 mg by mouth 2 (two) times daily. 60 tablet 3   Cholecalciferol (VITAMIN D3) 25 MCG (1000 UT) CAPS Take 1 capsule (1,000 Units total) by mouth daily. 60 capsule 3   donepezil (ARICEPT) 5 MG tablet Take 1 tablet by mouth once daily 90 tablet 0   levothyroxine (SYNTHROID) 50 MCG tablet Take 1 tablet by mouth once daily 90 tablet 0   omeprazole (PRILOSEC) 20 MG capsule Take 1 capsule by mouth once daily 90 capsule 0   traZODone (DESYREL)  100 MG tablet TAKE 1 TABLET BY MOUTH AT BEDTIME 90 tablet 0   venlafaxine (EFFEXOR) 75 MG tablet Take 1 tablet by mouth twice daily 60 tablet 0   No facility-administered medications prior to visit.    Allergies  Allergen Reactions   Prednisone Other (See Comments)    Kept her up and sent her into a manic mood    Sulfa Antibiotics Swelling    ROS Review of Systems  Constitutional:  Negative for fatigue and fever.  Eyes:  Negative for visual disturbance.  Respiratory:  Negative for chest tightness and shortness of breath.   Cardiovascular:  Negative for chest pain and palpitations.  Musculoskeletal:  Positive for arthralgias. Negative for joint swelling, neck pain and neck stiffness.  Neurological:  Negative for  dizziness and headaches.  Psychiatric/Behavioral:  Negative for self-injury and suicidal ideas.       Objective:    Physical Exam HENT:     Head: Normocephalic.  Cardiovascular:     Rate and Rhythm: Normal rate and regular rhythm.     Pulses: Normal pulses.     Heart sounds: Normal heart sounds.  Pulmonary:     Effort: Pulmonary effort is normal.     Breath sounds: Normal breath sounds.  Musculoskeletal:     Left hand: Tenderness and bony tenderness present. No swelling, deformity or lacerations. Normal pulse.     Comments: Mild pain with palpation at the [facility] joint  Skin:    General: Skin is warm.     Findings: Lesion (scab on the right lower chin) present.  Neurological:     Mental Status: She is alert.      BP 122/76   Pulse 97   Ht _0  (1.676 m)   Wt 174 lb (78.9 kg)   SpO2 (!) 60%   BMI 28.08 kg/m  Wt Readings from Last 3 Encounters:  07/11/22 174 lb (78.9 kg)  01/08/22 159 lb (72.1 kg)  08/10/21 174 lb (78.9 kg)    Lab Results  Component Value Date   TSH 2.060 01/02/2022   Lab Results  Component Value Date   WBC 5.1 08/01/2021   HGB 13.0 08/01/2021   HCT 37.8 08/01/2021   MCV 92 08/01/2021   PLT 206 08/01/2021   Lab Results  Component Value Date   NA 142 01/02/2022   K 4.1 01/02/2022   CO2 19 (L) 01/02/2022   GLUCOSE 84 01/02/2022   BUN 21 01/02/2022   CREATININE 1.00 01/02/2022   BILITOT 0.6 01/02/2022   ALKPHOS 130 (H) 01/02/2022   AST 20 01/02/2022   ALT 12 01/02/2022   PROT 6.8 01/02/2022   ALBUMIN 4.7 01/02/2022   CALCIUM 9.2 01/02/2022   EGFR 64 01/02/2022   Lab Results  Component Value Date   CHOL 189 08/01/2021   Lab Results  Component Value Date   HDL 107 08/01/2021   Lab Results  Component Value Date   LDLCALC 72 08/01/2021   Lab Results  Component Value Date   TRIG 54 08/01/2021   No results found for: "CHOLHDL" Lab Results  Component Value Date   HGBA1C 5.3 08/01/2021      Assessment & Plan:   Problem  List Items Addressed This Visit       Endocrine   Hypothyroidism   Relevant Orders   TSH + free T4     Musculoskeletal and Integument   Rash of face - Primary    Will treat pruritus of the scalp on her  right lower chin with Doxepin 5% cream         Other   Vitamin D deficiency   Relevant Orders   VITAMIN D 25 Hydroxy (Vit-D Deficiency, Fractures)   Left hand pain    Referral placed to orthopedic surgery for possible steroid injections Encourage the patient to wear a hand brace on the left hand while working Encouraged to continue taking over-the-counter analgesics as needed for pain control      Other Visit Diagnoses     Pruritic rash       Relevant Medications   Doxepin HCl 5 % CREA   Chronic hand pain, left       Relevant Orders   Ambulatory referral to Orthopedic Surgery   IFG (impaired fasting glucose)       Relevant Orders   Hemoglobin A1c   Other hyperlipidemia       Relevant Orders   Lipid panel   CMP14+EGFR   CBC with Differential/Platelet       Meds ordered this encounter  Medications   Doxepin HCl 5 % CREA    Sig: Apply a thin film 4 times/day with at least 3- to 4-hour interval between applications; not recommended for use >8 days.    Dispense:  30 g    Refill:  0     Follow-up: Return in about 3 months (around 10/11/2022).    Alvira Monday, FNP

## 2022-07-11 NOTE — Assessment & Plan Note (Signed)
Will treat pruritus of the scalp on her right lower chin with Doxepin 5% cream

## 2022-07-11 NOTE — Assessment & Plan Note (Signed)
Referral placed to orthopedic surgery for possible steroid injections Encourage the patient to wear a hand brace on the left hand while working Encouraged to continue taking over-the-counter analgesics as needed for pain control

## 2022-07-11 NOTE — Patient Instructions (Addendum)
I appreciate the opportunity to provide care to you today!    Follow up:  3 months  Labs: please stop by the lab today to get your blood drawn (CBC, CMP, TSH, Lipid profile, HgA1c, Vit D)  Please pick up your prescription for topical antihistamine cream  (Doxepin 5% cream) to help with the itching of the rash in the face. Please apply the cream 4 times daily with at least 2 to 4 hours interval in between application  Referrals today-orthopedic surgery for left hand pain   Please continue to a heart-healthy diet and increase your physical activities. Try to exercise for at least three times a week.      It was a pleasure to see you and I look forward to continuing to work together on your health and well-being. Please do not hesitate to call the office if you need care or have questions about your care.   Have a wonderful day and week. With Gratitude, Gilmore Laroche MSN, FNP-BC

## 2022-07-12 LAB — LIPID PANEL
Chol/HDL Ratio: 1.7 ratio (ref 0.0–4.4)
Cholesterol, Total: 169 mg/dL (ref 100–199)
HDL: 98 mg/dL (ref >39)
LDL Chol Calc (NIH): 61 mg/dL (ref 0–99)
Triglycerides: 45 mg/dL (ref 0–149)
VLDL Cholesterol Cal: 10 mg/dL (ref 5–40)

## 2022-07-12 LAB — CBC WITH DIFFERENTIAL/PLATELET
Basophils Absolute: 0 10*3/uL (ref 0.0–0.2)
Basos: 1 %
EOS (ABSOLUTE): 0.1 10*3/uL (ref 0.0–0.4)
Eos: 1 %
Hematocrit: 37.8 % (ref 34.0–46.6)
Hemoglobin: 12.6 g/dL (ref 11.1–15.9)
Immature Grans (Abs): 0 10*3/uL (ref 0.0–0.1)
Immature Granulocytes: 0 %
Lymphocytes Absolute: 0.8 10*3/uL (ref 0.7–3.1)
Lymphs: 18 %
MCH: 30.8 pg (ref 26.6–33.0)
MCHC: 33.3 g/dL (ref 31.5–35.7)
MCV: 92 fL (ref 79–97)
Monocytes Absolute: 0.2 10*3/uL (ref 0.1–0.9)
Monocytes: 5 %
Neutrophils Absolute: 3.2 10*3/uL (ref 1.4–7.0)
Neutrophils: 75 %
Platelets: 166 10*3/uL (ref 150–450)
RBC: 4.09 x10E6/uL (ref 3.77–5.28)
RDW: 12.8 % (ref 11.7–15.4)
WBC: 4.3 10*3/uL (ref 3.4–10.8)

## 2022-07-12 LAB — CMP14+EGFR
ALT: 12 IU/L (ref 0–32)
AST: 22 IU/L (ref 0–40)
Albumin/Globulin Ratio: 2.2 (ref 1.2–2.2)
Albumin: 4.1 g/dL (ref 3.9–4.9)
Alkaline Phosphatase: 126 IU/L — ABNORMAL HIGH (ref 44–121)
BUN/Creatinine Ratio: 20 (ref 12–28)
BUN: 20 mg/dL (ref 8–27)
Bilirubin Total: 0.4 mg/dL (ref 0.0–1.2)
CO2: 22 mmol/L (ref 20–29)
Calcium: 8.6 mg/dL — ABNORMAL LOW (ref 8.7–10.3)
Chloride: 109 mmol/L — ABNORMAL HIGH (ref 96–106)
Creatinine, Ser: 1 mg/dL (ref 0.57–1.00)
Globulin, Total: 1.9 g/dL (ref 1.5–4.5)
Glucose: 109 mg/dL — ABNORMAL HIGH (ref 70–99)
Potassium: 3.9 mmol/L (ref 3.5–5.2)
Sodium: 145 mmol/L — ABNORMAL HIGH (ref 134–144)
Total Protein: 6 g/dL (ref 6.0–8.5)
eGFR: 63 mL/min/{1.73_m2} (ref >59)

## 2022-07-12 LAB — HEMOGLOBIN A1C
Est. average glucose Bld gHb Est-mCnc: 114 mg/dL
Hgb A1c MFr Bld: 5.6 % (ref 4.8–5.6)

## 2022-07-12 LAB — TSH+FREE T4
Free T4: 1.55 ng/dL (ref 0.82–1.77)
TSH: 2.85 u[IU]/mL (ref 0.450–4.500)

## 2022-07-12 LAB — VITAMIN D 25 HYDROXY (VIT D DEFICIENCY, FRACTURES): Vit D, 25-Hydroxy: 33.7 ng/mL (ref 30.0–100.0)

## 2022-07-16 ENCOUNTER — Ambulatory Visit (INDEPENDENT_AMBULATORY_CARE_PROVIDER_SITE_OTHER): Payer: Medicaid Other

## 2022-07-16 ENCOUNTER — Encounter: Payer: Self-pay | Admitting: Orthopaedic Surgery

## 2022-07-16 ENCOUNTER — Ambulatory Visit (INDEPENDENT_AMBULATORY_CARE_PROVIDER_SITE_OTHER): Payer: Medicaid Other | Admitting: Orthopaedic Surgery

## 2022-07-16 VITALS — BP 134/63 | HR 53 | Ht 66.0 in | Wt 176.0 lb

## 2022-07-16 DIAGNOSIS — M654 Radial styloid tenosynovitis [de Quervain]: Secondary | ICD-10-CM | POA: Diagnosis not present

## 2022-07-16 MED ORDER — METHYLPREDNISOLONE ACETATE 40 MG/ML IJ SUSP
40.0000 mg | Freq: Once | INTRAMUSCULAR | Status: AC
  Administered 2022-07-16: 40 mg via INTRA_ARTICULAR

## 2022-07-16 NOTE — Progress Notes (Signed)
Subjective:    Patient ID: Anita Berry, female    DOB: September 18, 1960, 61 y.o.   MRN: [id number]  HPI She has had pain of the left thumb over the first compartment area over the last four to six weeks.  It happened after doing a lot of work with hands and wringing.  She has no redness or swelling but it hurts to use the thumb.  She has tried heat and Tylenol with little help.   Review of Systems  Constitutional:  Positive for activity change.  Musculoskeletal:  Positive for arthralgias.  All other systems reviewed and are negative. For Review of Systems, all other systems reviewed and are negative.  The following is a summary of the past history medically, past history surgically, known current medicines, social history and family history.  This information is gathered electronically by the computer from prior information and documentation.  I review this each visit and have found including this information at this point in the chart is beneficial and informative.   Past Medical History:  Diagnosis Date   Bipolar 1 disorder (HCC)    Bipolar disorder, unspecified (HCC)    Dementia (HCC)    Thyroid disease     Past Surgical History:  Procedure Laterality Date   CHOLECYSTECTOMY     SHOULDER SURGERY Right    TONSILLECTOMY      Current Outpatient Medications on File Prior to Visit  Medication Sig Dispense Refill   Calcium Carb-Cholecalciferol (CALTRATE 600+D3) 600-20 MG-MCG TABS Take 600 mg by mouth 2 (two) times daily. 60 tablet 3   Cholecalciferol (VITAMIN D3) 25 MCG (1000 UT) CAPS Take 1 capsule (1,000 Units total) by mouth daily. 60 capsule 3   donepezil (ARICEPT) 5 MG tablet Take 1 tablet by mouth once daily 90 tablet 0   Doxepin HCl 5 % CREA Apply a thin film 4 times/day with at least 3- to 4-hour interval between applications; not recommended for use >8 days. 30 g 0   levothyroxine (SYNTHROID) 50 MCG tablet Take 1 tablet by mouth once daily 90 tablet 0   omeprazole (PRILOSEC) 20  MG capsule Take 1 capsule by mouth once daily 90 capsule 0   traZODone (DESYREL) 100 MG tablet TAKE 1 TABLET BY MOUTH AT BEDTIME 90 tablet 0   venlafaxine (EFFEXOR) 75 MG tablet Take 1 tablet by mouth twice daily 60 tablet 0   No current facility-administered medications on file prior to visit.    Social History   Socioeconomic History   Marital status: Single    Spouse name: Not on file   Number of children: 0   Years of education: Not on file   Highest education level: Not on file  Occupational History   Occupation: Retired    Comment: Naval architect- consulting  Tobacco Use   Smoking status: Never   Smokeless tobacco: Never  Building services engineer Use: Never used  Substance and Sexual Activity   Alcohol use: Not Currently    Comment: none recently   Drug use: Never   Sexual activity: Not Currently  Other Topics Concern   Not on file  Social History Narrative   Never been married; boyfriend when she was 14 committed suicide, and she hasn't had solid relationship since   Social Determinants of Health   Financial Resource Strain: Not on file  Food Insecurity: Not on file  Transportation Needs: Not on file  Physical Activity: Not on file  Stress: Not on file  Social Connections:  Not on file  Intimate Partner Violence: Not on file    History reviewed. No pertinent family history.  BP 134/63   Pulse (!) 53   Ht 5\' 6"  (1.676 m)   Wt 176 lb (79.8 kg)   BMI 28.41 kg/m   Body mass index is 28.41 kg/m.      Objective:   Physical Exam Vitals and nursing note reviewed. Exam conducted with a chaperone present.  Constitutional:      Appearance: She is well-developed.  HENT:     Head: Normocephalic and atraumatic.  Eyes:     Conjunctiva/sclera: Conjunctivae normal.     Pupils: Pupils are equal, round, and reactive to light.  Cardiovascular:     Rate and Rhythm: Normal rate and regular rhythm.  Pulmonary:     Effort: Pulmonary effort is normal.  Abdominal:      Palpations: Abdomen is soft.  Musculoskeletal:       Hands:     Cervical back: Normal range of motion and neck supple.  Skin:    General: Skin is warm and dry.  Neurological:     Mental Status: She is alert and oriented to person, place, and time.     Cranial Nerves: No cranial nerve deficit.     Motor: No abnormal muscle tone.     Coordination: Coordination normal.     Deep Tendon Reflexes: Reflexes are normal and symmetric. Reflexes normal.  Psychiatric:        Behavior: Behavior normal.        Thought Content: Thought content normal.        Judgment: Judgment normal.     X-rays were done of the left thumb, reported separately.      Assessment & Plan:   Encounter Diagnosis  Name Primary?   De Quervain's tenosynovitis, left Yes   Procedure note: After permission from the patient and prep of the first extensor compartment area of the left thumb, I injected by sterile technique 1 % xylocaine and 1 cc DepoMedrol 40 tolerate well.  A thumb splint given.  Return in two weeks.  Call if any problem.  Precautions discussed.  Electronically Signed , MD 11/21/202311:47 AM

## 2022-07-16 NOTE — Patient Instructions (Signed)
Ice your left hand several times a day. It may hurt more today but that is normal. It should feel better tomorrow.   Dr.Keeling is here all day on Tuesdays, Wednesday mornings, and Thursday mornings. If you need anything such as a medication refill, please either call BEFORE the end of the day on [facility] or send a message through [place]. Your pharmacy can send a refill request for you. Calling by the end of the day on [facility] allows [place] time to send Dr.Keeling the request and for him to respond before he leaves on Thursdays.  If Dr. Hilda Lias is out of the office, we may send it to one of the other providers and they may not refill it for the same amount that your original prescription is for.   MY NAME IS ABBY AND I AM DR.KEELING'S ASSISTANT. IF YOU NEED ANYTHING, PLEASE DO NOT HESITATE TO EITHER SEND ME A MESSAGE VIA MYCHART OR CALL THE OFFICE [phone] AND LEAVE A MESSAGE FOR ME. I WILL RESPOND WITHIN 24-48 BUSINESS HOURS.   As the weather changes and gets cooler, you may notice you are affected more. You may have more pain in your joints. This is normal. Dress warmly and make sure that area is covered well.

## 2022-07-21 NOTE — Progress Notes (Signed)
Please inform the patient to be mindful of her salt intake. Her kidneys and liver function are stable.

## 2022-07-26 ENCOUNTER — Other Ambulatory Visit: Payer: Self-pay | Admitting: Family Medicine

## 2022-07-26 DIAGNOSIS — K219 Gastro-esophageal reflux disease without esophagitis: Secondary | ICD-10-CM

## 2022-07-26 NOTE — Telephone Encounter (Signed)
Ok to fill 

## 2022-07-29 LAB — COLOGUARD

## 2022-07-29 NOTE — Telephone Encounter (Signed)
yes

## 2022-07-30 ENCOUNTER — Ambulatory Visit (INDEPENDENT_AMBULATORY_CARE_PROVIDER_SITE_OTHER): Payer: Medicaid Other | Admitting: Orthopaedic Surgery

## 2022-07-30 ENCOUNTER — Encounter: Payer: Self-pay | Admitting: Orthopaedic Surgery

## 2022-07-30 VITALS — BP 118/69 | HR 50 | Ht 66.0 in | Wt 176.0 lb

## 2022-07-30 DIAGNOSIS — M654 Radial styloid tenosynovitis [de Quervain]: Secondary | ICD-10-CM

## 2022-07-30 NOTE — Progress Notes (Signed)
I am much better.  She has no further pain in the first extensor compartment on the left. NV intact.  Full ROM.  Encounter Diagnosis  Name Primary?   De Quervain's tenosynovitis, left Yes   I will see as needed.  Call if any problem.  Precautions discussed.  Electronically Signed Darreld Mclean, MD 12/5/20239:35 AM

## 2022-08-09 ENCOUNTER — Encounter: Payer: Self-pay | Admitting: Family Medicine

## 2022-08-09 ENCOUNTER — Ambulatory Visit (INDEPENDENT_AMBULATORY_CARE_PROVIDER_SITE_OTHER): Payer: Medicaid Other | Admitting: Family Medicine

## 2022-08-09 DIAGNOSIS — U071 COVID-19: Secondary | ICD-10-CM

## 2022-08-09 MED ORDER — NIRMATRELVIR/RITONAVIR (PAXLOVID)TABLET: 3.0000 | ORAL_TABLET | Freq: Two times a day (BID) | ORAL | 0 refills | Status: AC

## 2022-08-09 NOTE — Progress Notes (Signed)
Virtual Visit via Telephone Note   This visit type was conducted via telephone. This format is felt to be most appropriate for this patient at this time.  The patient did not have access to video technology/had technical difficulties with video requiring transitioning to audio format only (telephone).  All issues noted in this document were discussed and addressed.  No physical exam could be performed with this format.  Evaluation Performed:  Follow-up visit  Date:  08/09/2022   ID:  Anita Berry, DOB Dec 25, 1960, MRN [id number]  Patient Location: Home Provider Location: Clinic  Participants: Patient Location of Patient: Home Location of Provider:clinic Consent was obtain for visit to be over via telehealth. I verified that I am speaking with the correct person using two identifiers.  PCP:  Gilmore Laroche, FNP   Chief Complaint:  covid positive  History of Present Illness:    Anita Berry is a 61 y.o. female with a positive COVID test on 08/09/2022.  She complains of a slight cough and  headaches. She denies fever, chills, loss of smell and taste, nasal congestion, and shortness of breath.    The patient does have symptoms concerning for COVID-19 infection (fever, chills, cough, or new shortness of breath).   Past Medical, Surgical, Social History, Allergies, and Medications have been Reviewed.  Past Medical History:  Diagnosis Date   Bipolar 1 disorder (HCC)    Bipolar disorder, unspecified (HCC)    Dementia (HCC)    Thyroid disease    Past Surgical History:  Procedure Laterality Date   CHOLECYSTECTOMY     SHOULDER SURGERY Right    TONSILLECTOMY       Current Meds  Medication Sig   Calcium Carb-Cholecalciferol (CALTRATE 600+D3) 600-20 MG-MCG TABS Take 600 mg by mouth 2 (two) times daily.   Cholecalciferol (VITAMIN D3) 25 MCG (1000 UT) CAPS Take 1 capsule (1,000 Units total) by mouth daily.   donepezil (ARICEPT) 5 MG tablet Take 1 tablet by mouth once daily    Doxepin HCl 5 % CREA Apply a thin film 4 times/day with at least 3- to 4-hour interval between applications; not recommended for use >8 days.   levothyroxine (SYNTHROID) 50 MCG tablet Take 1 tablet by mouth once daily   nirmatrelvir/ritonavir EUA (PAXLOVID) 20 x 150 MG & 10 x 100MG  TABS Take 3 tablets by mouth 2 (two) times daily for 5 days. (Take nirmatrelvir 150 mg two tablets twice daily for 5 days and ritonavir 100 mg one tablet twice daily for 5 days) Patient GFR is 63   omeprazole (PRILOSEC) 20 MG capsule Take 1 capsule by mouth once daily   traZODone (DESYREL) 100 MG tablet TAKE 1 TABLET BY MOUTH AT BEDTIME   venlafaxine (EFFEXOR) 75 MG tablet Take 1 tablet by mouth twice daily     Allergies:   Prednisone and Sulfa antibiotics   ROS:   Please see the history of present illness.     All other systems reviewed and are negative.   Labs/Other Tests and Data Reviewed:    Recent Labs: 07/11/2022: ALT 12; BUN 20; Creatinine, Ser 1.00; Hemoglobin 12.6; Platelets 166; Potassium 3.9; Sodium 145; TSH 2.850   Recent Lipid Panel Lab Results  Component Value Date/Time   CHOL 169 07/11/2022 10:39 AM   TRIG 45 07/11/2022 10:39 AM   HDL 98 07/11/2022 10:39 AM   CHOLHDL 1.7 07/11/2022 10:39 AM   LDLCALC 61 07/11/2022 10:39 AM    Wt Readings from Last 3 Encounters:  07/30/22  176 lb (79.8 kg)  07/16/22 176 lb (79.8 kg)  07/11/22 174 lb (78.9 kg)     Objective:    Vital Signs:  There were no vitals taken for this visit.     ASSESSMENT & PLAN:   COVID-19 positive Will treat patient with Paxlovid Encouraged to stay well-hydrated and isolate for 5 days from the onset of symptoms  Time:   Today, I have spent 12 minutes reviewing the chart, including problem list, medications, and with the patient with telehealth technology discussing the above problems.   Medication Adjustments/Labs and Tests Ordered: Current medicines are reviewed at length with the patient today.  Concerns  regarding medicines are outlined above.   Tests Ordered: No orders of the defined types were placed in this encounter.   Medication Changes: Meds ordered this encounter  Medications   nirmatrelvir/ritonavir EUA (PAXLOVID) 20 x 150 MG & 10 x 100MG  TABS    Sig: Take 3 tablets by mouth 2 (two) times daily for 5 days. (Take nirmatrelvir 150 mg two tablets twice daily for 5 days and ritonavir 100 mg one tablet twice daily for 5 days) Patient GFR is 63    Dispense:  30 tablet    Refill:  0     Note: This dictation was prepared with Dragon dictation along with smaller phrase technology. Similar sounding words can be transcribed inadequately or may not be corrected upon review. Any transcriptional errors that result from this process are unintentional.      Disposition:  Follow up  Signed, , FNP  08/09/2022 8:46 PM     08/11/2022 [facility]

## 2022-08-15 ENCOUNTER — Other Ambulatory Visit: Payer: Self-pay | Admitting: Nurse Practitioner

## 2022-08-29 ENCOUNTER — Other Ambulatory Visit: Payer: Self-pay | Admitting: Nurse Practitioner

## 2022-08-29 DIAGNOSIS — F3131 Bipolar disorder, current episode depressed, mild: Secondary | ICD-10-CM

## 2022-08-31 ENCOUNTER — Other Ambulatory Visit: Payer: Self-pay | Admitting: Nurse Practitioner

## 2022-09-17 ENCOUNTER — Ambulatory Visit (INDEPENDENT_AMBULATORY_CARE_PROVIDER_SITE_OTHER): Payer: Medicaid Other | Admitting: Orthopaedic Surgery

## 2022-09-17 ENCOUNTER — Encounter: Payer: Self-pay | Admitting: Orthopaedic Surgery

## 2022-09-17 DIAGNOSIS — M654 Radial styloid tenosynovitis [de Quervain]: Secondary | ICD-10-CM

## 2022-09-17 DIAGNOSIS — M21612 Bunion of left foot: Secondary | ICD-10-CM

## 2022-09-17 MED ORDER — METHYLPREDNISOLONE ACETATE 40 MG/ML IJ SUSP
40.0000 mg | Freq: Once | INTRAMUSCULAR | Status: AC
  Administered 2022-09-17: 40 mg via INTRA_ARTICULAR

## 2022-09-17 NOTE — Addendum Note (Signed)
Addended by: Obie Dredge A on: 09/17/2022 10:53 AM   Modules accepted: Orders

## 2022-09-17 NOTE — Progress Notes (Signed)
My thumb is hurting again.  She has recurrence of de quervains on the left.  Procedure note: After permission from the patient and prep of the left first extensor compartment area, I injected 1% plain Xylocaine and 1 cc DepoMedrol 40 by sterile technique into the tendon sheath area tolerated well.  Return in three weeks.  She also has a bunion on the left foot and I will have her see podiatry.  Encounter Diagnoses  Name Primary?   Bunion of great toe of left foot    De Quervain's tenosynovitis, left Yes   Call if any problem.  Precautions discussed.  Electronically Signed Sanjuana Kava, MD 1/23/202410:03 AM

## 2022-09-17 NOTE — Patient Instructions (Addendum)
WE HAVE PLACED A REFERRAL TO [facility] IN [place]. IF YOU HAVEN'T HEARD FROM THEM IN A WEEK, CALL TO MAKE YOUR APPOINTMENT.  ROV 3 WEEKS

## 2022-09-23 ENCOUNTER — Ambulatory Visit (INDEPENDENT_AMBULATORY_CARE_PROVIDER_SITE_OTHER): Payer: Medicaid Other

## 2022-09-23 ENCOUNTER — Other Ambulatory Visit: Payer: Self-pay | Admitting: Family Medicine

## 2022-09-23 ENCOUNTER — Encounter: Payer: Self-pay | Admitting: Podiatry

## 2022-09-23 ENCOUNTER — Ambulatory Visit (INDEPENDENT_AMBULATORY_CARE_PROVIDER_SITE_OTHER): Payer: Medicaid Other | Admitting: Podiatry

## 2022-09-23 DIAGNOSIS — M2041 Other hammer toe(s) (acquired), right foot: Secondary | ICD-10-CM

## 2022-09-23 DIAGNOSIS — G4700 Insomnia, unspecified: Secondary | ICD-10-CM

## 2022-09-23 DIAGNOSIS — M21612 Bunion of left foot: Secondary | ICD-10-CM

## 2022-09-23 DIAGNOSIS — M2042 Other hammer toe(s) (acquired), left foot: Secondary | ICD-10-CM | POA: Diagnosis not present

## 2022-09-24 NOTE — Progress Notes (Signed)
Subjective:   Patient ID: Anita Berry, female   DOB: 63 y.o.   MRN: [id number]   HPI Patient presents with severe bunion deformity left over right hammertoe deformity that is elevated and rigid.  Patient would like to avoid surgery but wants to know what could be done and what kind of modification she can do.  She does not smoke tries to be active   Review of Systems  All other systems reviewed and are negative.       Objective:  Physical Exam Vitals and nursing note reviewed.  Constitutional:      Appearance: She is well-developed.  Pulmonary:     Effort: Pulmonary effort is normal.  Musculoskeletal:        General: Normal range of motion.  Skin:    General: Skin is warm.  Neurological:     Mental Status: She is alert.     Neurovascular status found to be intact muscle strength found to be adequate range of motion adequate with rigid contracture digit to bilateral with significant bunion deformity left over right with enlargement of the head of the metatarsal bone and moderate flatfoot deformity.  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  She does not want surgery currently and this would be an aggressive surgery that would need to be done with probable Lapidus fusion and the possibility of first MPJ fusion which may be necessary along with digital stabilization.  All this was made aware to patient  X-rays indicate significant structural malalignment left over right with severe adductus deformity and rigid contracture of lesser digits

## 2022-09-29 ENCOUNTER — Other Ambulatory Visit: Payer: Self-pay | Admitting: Family Medicine

## 2022-09-29 DIAGNOSIS — F3131 Bipolar disorder, current episode depressed, mild: Secondary | ICD-10-CM

## 2022-10-08 ENCOUNTER — Ambulatory Visit: Payer: Medicaid Other | Admitting: Orthopaedic Surgery

## 2022-10-11 ENCOUNTER — Encounter: Payer: Self-pay | Admitting: Family Medicine

## 2022-10-11 ENCOUNTER — Ambulatory Visit (INDEPENDENT_AMBULATORY_CARE_PROVIDER_SITE_OTHER): Payer: Medicaid Other | Admitting: Family Medicine

## 2022-10-11 VITALS — BP 136/84 | HR 67 | Ht 66.0 in | Wt 162.1 lb

## 2022-10-11 DIAGNOSIS — E038 Other specified hypothyroidism: Secondary | ICD-10-CM | POA: Diagnosis not present

## 2022-10-11 DIAGNOSIS — F3160 Bipolar disorder, current episode mixed, unspecified: Secondary | ICD-10-CM | POA: Diagnosis not present

## 2022-10-11 DIAGNOSIS — K219 Gastro-esophageal reflux disease without esophagitis: Secondary | ICD-10-CM | POA: Diagnosis not present

## 2022-10-11 DIAGNOSIS — I499 Cardiac arrhythmia, unspecified: Secondary | ICD-10-CM | POA: Diagnosis not present

## 2022-10-11 DIAGNOSIS — E559 Vitamin D deficiency, unspecified: Secondary | ICD-10-CM

## 2022-10-11 DIAGNOSIS — E7849 Other hyperlipidemia: Secondary | ICD-10-CM

## 2022-10-11 DIAGNOSIS — R7301 Impaired fasting glucose: Secondary | ICD-10-CM

## 2022-10-11 NOTE — Progress Notes (Signed)
Established Patient Office Visit  Subjective:  Patient ID: Anita Berry, female    DOB: [birth year]  Age: 62 y.o. MRN: [id number]  CC:  Chief Complaint  Patient presents with   Follow-up    3 month f/u, pt reports having covid over christmas and has been noticing irregular heart sx like fast heart beat then slow heart beat.     HPI Anita Berry is a 62 y.o. female with past medical history of hypothyroidism, bipolar, and GERD presents for f/u of  chronic medical conditions. For the details of today's visit, please refer to the assessment and plan.     Past Medical History:  Diagnosis Date   Bipolar 1 disorder ([place])    Bipolar disorder, unspecified ([place])    Dementia ([place])    Thyroid disease     Past Surgical History:  Procedure Laterality Date   CHOLECYSTECTOMY     SHOULDER SURGERY Right    TONSILLECTOMY      History reviewed. No pertinent family history.  Social History   Socioeconomic History   Marital status: Single    Spouse name: Not on file   Number of children: 0   Years of education: Not on file   Highest education level: Not on file  Occupational History   Occupation: Retired    Comment: Scientist, clinical (histocompatibility and immunogenetics)- consulting  Tobacco Use   Smoking status: Never   Smokeless tobacco: Never  Scientific laboratory technician Use: Never used  Substance and Sexual Activity   Alcohol use: Not Currently    Comment: none recently   Drug use: Never   Sexual activity: Not Currently  Other Topics Concern   Not on file  Social History Narrative   Never been married; boyfriend when she was 14 committed suicide, and she hasn't had solid relationship since   Social Determinants of Health   Financial Resource Strain: Not on file  Food Insecurity: Not on file  Transportation Needs: Not on file  Physical Activity: Not on file  Stress: Not on file  Social Connections: Not on file  Intimate Partner Violence: Not on file    Outpatient Medications Prior to Visit  Medication Sig  Dispense Refill   Calcium Carb-Cholecalciferol (CALTRATE 600+D3) 600-20 MG-MCG TABS Take 600 mg by mouth 2 (two) times daily. 60 tablet 3   Cholecalciferol (VITAMIN D3) 25 MCG (1000 UT) CAPS Take 1 capsule (1,000 Units total) by mouth daily. 60 capsule 3   donepezil (ARICEPT) 5 MG tablet Take 1 tablet by mouth once daily 90 tablet 0   Doxepin HCl 5 % CREA Apply a thin film 4 times/day with at least 3- to 4-hour interval between applications; not recommended for use >8 days. 30 g 0   levothyroxine (SYNTHROID) 50 MCG tablet Take 1 tablet by mouth once daily 90 tablet 0   omeprazole (PRILOSEC) 20 MG capsule Take 1 capsule by mouth once daily 90 capsule 0   traZODone (DESYREL) 100 MG tablet TAKE 1 TABLET BY MOUTH AT BEDTIME 90 tablet 0   venlafaxine (EFFEXOR) 75 MG tablet Take 1 tablet by mouth twice daily 60 tablet 0   No facility-administered medications prior to visit.    Allergies  Allergen Reactions   Prednisone Other (See Comments)    Kept her up and sent her into a manic mood    Sulfa Antibiotics Swelling    ROS Review of Systems  Constitutional:  Negative for chills and fever.  Eyes:  Negative for visual disturbance.  Respiratory:  Negative for cough, chest tightness, shortness of breath and wheezing.   Cardiovascular:  Negative for chest pain.  Neurological:  Negative for dizziness and headaches.      Objective:    Physical Exam HENT:     Head: Normocephalic.     Mouth/Throat:     Mouth: Mucous membranes are moist.  Cardiovascular:     Rate and Rhythm: Normal rate.     Heart sounds: Normal heart sounds.  Pulmonary:     Effort: Pulmonary effort is normal.     Breath sounds: Normal breath sounds.  Neurological:     Mental Status: She is alert.     BP 136/84   Pulse 67   Ht 5' 6"$  (1.676 m)   Wt 162 lb 1.3 oz (73.5 kg)   SpO2 99%   BMI 26.16 kg/m  Wt Readings from Last 3 Encounters:  10/11/22 162 lb 1.3 oz (73.5 kg)  07/30/22 176 lb (79.8 kg)  07/16/22 176  lb (79.8 kg)    Lab Results  Component Value Date   TSH 2.850 07/11/2022   Lab Results  Component Value Date   WBC 4.3 07/11/2022   HGB 12.6 07/11/2022   HCT 37.8 07/11/2022   MCV 92 07/11/2022   PLT 166 07/11/2022   Lab Results  Component Value Date   NA 145 (H) 07/11/2022   K 3.9 07/11/2022   CO2 22 07/11/2022   GLUCOSE 109 (H) 07/11/2022   BUN 20 07/11/2022   CREATININE 1.00 07/11/2022   BILITOT 0.4 07/11/2022   ALKPHOS 126 (H) 07/11/2022   AST 22 07/11/2022   ALT 12 07/11/2022   PROT 6.0 07/11/2022   ALBUMIN 4.1 07/11/2022   CALCIUM 8.6 (L) 07/11/2022   EGFR 63 07/11/2022   Lab Results  Component Value Date   CHOL 169 07/11/2022   Lab Results  Component Value Date   HDL 98 07/11/2022   Lab Results  Component Value Date   LDLCALC 61 07/11/2022   Lab Results  Component Value Date   TRIG 45 07/11/2022   Lab Results  Component Value Date   CHOLHDL 1.7 07/11/2022   Lab Results  Component Value Date   HGBA1C 5.6 07/11/2022      Assessment & Plan:  Irregular heartbeat Assessment & Plan: Reports irregular heart rate since having COVID in December 2023 She reports elevated heart rate at rest and, at time lower then normal heart rate Symptoms are more pronounced when lying supine Denies symptoms of congestive heart failure EKG in office shows sinus bradycardia Referral placed to cardiology for further evaluation   Orders: -     EKG 12-Lead -     Ambulatory referral to Cardiology  Other specified hypothyroidism Assessment & Plan: Stable on Synthroid 50 mcg daily Will assess thyroid levels today Lab Results  Component Value Date   TSH 2.850 07/11/2022     Orders: -     TSH + free T4  Bipolar affective disorder, current episode mixed, current episode severity unspecified ([place]) Assessment & Plan: Stable on lisinopril 75 mg daily   Gastroesophageal reflux disease, unspecified whether esophagitis present Assessment & Plan: Stable on  omeprazole 20 mg daily   Vitamin D deficiency -     VITAMIN D 25 Hydroxy (Vit-D Deficiency, Fractures)  IFG (impaired fasting glucose) -     Hemoglobin A1c  Other hyperlipidemia -     CBC with Differential/Platelet -     CMP14+EGFR -     Lipid panel  Follow-up: Return in about 3 months (around 01/09/2023).   Alvira Monday, FNP

## 2022-10-11 NOTE — Patient Instructions (Signed)
I appreciate the opportunity to provide care to you today!    Follow up:  3 months  Labs: please stop by the lab today/ during the week to get your blood drawn (CBC, CMP, TSH, Lipid profile, HgA1c, Vit D)    Referrals today- cardiology   Please continue to a heart-healthy diet and increase your physical activities. Try to exercise for 48mns at least five times a week.      It was a pleasure to see you and I look forward to continuing to work together on your health and well-being. Please do not hesitate to call the office if you need care or have questions about your care.   Have a wonderful day and week. With Gratitude, GAlvira MondayMSN, FNP-BC

## 2022-10-11 NOTE — Assessment & Plan Note (Signed)
Stable on Synthroid 50 mcg daily Will assess thyroid levels today Lab Results  Component Value Date   TSH 2.850 07/11/2022

## 2022-10-11 NOTE — Assessment & Plan Note (Addendum)
Reports irregular heart rate since having COVID in December 2023 She reports elevated heart rate at rest and, at time lower then normal heart rate Symptoms are more pronounced when lying supine Denies symptoms of congestive heart failure EKG in office shows sinus bradycardia Referral placed to cardiology for further evaluation

## 2022-10-11 NOTE — Assessment & Plan Note (Signed)
Stable on lisinopril 75 mg daily

## 2022-10-11 NOTE — Assessment & Plan Note (Signed)
Stable on omeprazole 20 mg daily

## 2022-10-14 LAB — COLOGUARD: COLOGUARD: NEGATIVE

## 2022-10-24 ENCOUNTER — Encounter: Payer: Self-pay | Admitting: Radiology

## 2022-10-30 ENCOUNTER — Ambulatory Visit: Payer: Medicaid Other | Attending: Internal Medicine | Admitting: Internal Medicine

## 2022-10-30 ENCOUNTER — Ambulatory Visit: Payer: Medicaid Other | Attending: Internal Medicine

## 2022-10-30 ENCOUNTER — Encounter: Payer: Self-pay | Admitting: Internal Medicine

## 2022-10-30 ENCOUNTER — Other Ambulatory Visit: Payer: Self-pay | Admitting: Internal Medicine

## 2022-10-30 ENCOUNTER — Encounter: Payer: Self-pay | Admitting: *Deleted

## 2022-10-30 VITALS — BP 122/74 | HR 59 | Ht 66.0 in | Wt 162.2 lb

## 2022-10-30 DIAGNOSIS — R002 Palpitations: Secondary | ICD-10-CM

## 2022-10-30 DIAGNOSIS — I499 Cardiac arrhythmia, unspecified: Secondary | ICD-10-CM | POA: Diagnosis not present

## 2022-10-30 DIAGNOSIS — R079 Chest pain, unspecified: Secondary | ICD-10-CM

## 2022-10-30 DIAGNOSIS — R42 Dizziness and giddiness: Secondary | ICD-10-CM | POA: Diagnosis not present

## 2022-10-30 DIAGNOSIS — R0609 Other forms of dyspnea: Secondary | ICD-10-CM

## 2022-10-30 DIAGNOSIS — R001 Bradycardia, unspecified: Secondary | ICD-10-CM

## 2022-10-30 NOTE — Patient Instructions (Addendum)
Medication Instructions:  Your physician recommends that you continue on your current medications as directed. Please refer to the Current Medication list given to you today.  Labwork: none  Testing/Procedures: Your physician has requested that you have an echocardiogram. Echocardiography is a painless test that uses sound waves to create images of your heart. It provides your doctor with information about the size and shape of your heart and how well your heart's chambers and valves are working. This procedure takes approximately one hour. There are no restrictions for this procedure. Please do NOT wear cologne, perfume, aftershave, or lotions (deodorant is allowed). Please arrive 15 minutes prior to your appointment time. Your physician has requested that you have en exercise stress myoview. For further information please visit [email or web address]. Please follow instruction sheet, as given. Your physician has recommended that you wear a Zio monitor.   This monitor is a medical device that records the heart's electrical activity. Doctors most often use these monitors to diagnose arrhythmias. Arrhythmias are problems with the speed or rhythm of the heartbeat. The monitor is a small device applied to your chest. You can wear one while you do your normal daily activities. While wearing this monitor if you have any symptoms to push the button and record what you felt. Once you have worn this monitor for the period of time provider prescribed (for 7 days), you will return the monitor device in the postage paid box. Once it is returned they will download the data collected and provide [place] with a report which the provider will then review and we will call you with those results. Important tips:  Avoid showering during the first 24 hours of wearing the monitor. Avoid excessive sweating to help maximize wear time. Do not submerge the device, no hot tubs, and no swimming pools. Keep any lotions or oils away  from the patch. After 24 hours you may shower with the patch on. Take brief showers with your back facing the shower head.  Do not remove patch once it has been placed because that will interrupt data and decrease adhesive wear time. Push the button when you have any symptoms and write down what you were feeling. Once you have completed wearing your monitor, remove and place into box which has postage paid and place in your outgoing mailbox.  If for some reason you have misplaced your box then call our office and we can provide another box and/or mail it off for you.  Follow-Up: Your physician recommends that you schedule a follow-up appointment in: as needed  Any Other Special Instructions Will Be Listed Below (If Applicable).  If you need a refill on your cardiac medications before your next appointment, please call your pharmacy.

## 2022-10-31 ENCOUNTER — Other Ambulatory Visit: Payer: Self-pay | Admitting: Family Medicine

## 2022-10-31 DIAGNOSIS — K219 Gastro-esophageal reflux disease without esophagitis: Secondary | ICD-10-CM

## 2022-10-31 DIAGNOSIS — R001 Bradycardia, unspecified: Secondary | ICD-10-CM | POA: Insufficient documentation

## 2022-10-31 DIAGNOSIS — R0609 Other forms of dyspnea: Secondary | ICD-10-CM | POA: Insufficient documentation

## 2022-10-31 DIAGNOSIS — R079 Chest pain, unspecified: Secondary | ICD-10-CM | POA: Insufficient documentation

## 2022-10-31 NOTE — Progress Notes (Signed)
Cardiology Office Note  Date: 10/31/2022   ID: Anita Berry, DOB [birth year], MRN [id number]  PCP:  Alvira Monday, [place]  Cardiologist:  Chalmers Guest, MD Electrophysiologist:  None   Reason for Office Visit: Evaluation chest pain at the request of [place], FNP   History of Present Illness: Anita Berry is a 62 y.o. female known to have bipolar disorder was referred to cardiology clinic for evaluation of chest pain.  Patient started to have funny feeling in her chest, lasting for 5 to 10 seconds, no relation with rest or exercise, occurs every day since January 2024 and resolve spontaneously.  She also has DOE, especially with climbing stairs or walking uphill which she started to notice since the beginning of this year. Otherwise denies other symptoms like dizziness, syncope, lightheadedness, leg swelling and palpitations.    Past Medical History:  Diagnosis Date   Bipolar 1 disorder ([place])    Bipolar disorder, unspecified ([place])    Dementia ([place])    Thyroid disease     Past Surgical History:  Procedure Laterality Date   CHOLECYSTECTOMY     SHOULDER SURGERY Right    TONSILLECTOMY      Current Outpatient Medications  Medication Sig Dispense Refill   Calcium Carb-Cholecalciferol (CALTRATE 600+D3) 600-20 MG-MCG TABS Take 600 mg by mouth 2 (two) times daily. 60 tablet 3   Cholecalciferol (VITAMIN D3) 25 MCG (1000 UT) CAPS Take 1 capsule (1,000 Units total) by mouth daily. 60 capsule 3   donepezil (ARICEPT) 5 MG tablet Take 1 tablet by mouth once daily 90 tablet 0   Doxepin HCl 5 % CREA Apply a thin film 4 times/day with at least 3- to 4-hour interval between applications; not recommended for use >8 days. 30 g 0   levothyroxine (SYNTHROID) 50 MCG tablet Take 1 tablet by mouth once daily 90 tablet 0   omeprazole (PRILOSEC) 20 MG capsule Take 1 capsule by mouth once daily 90 capsule 0   traZODone (DESYREL) 100 MG tablet TAKE 1 TABLET BY MOUTH AT BEDTIME 90 tablet 0    venlafaxine (EFFEXOR) 75 MG tablet Take 1 tablet by mouth twice daily 60 tablet 0   No current facility-administered medications for this visit.   Allergies:  Prednisone and Sulfa antibiotics   Social History: The patient  reports that she has never smoked. She has never used smokeless tobacco. She reports that she does not currently use alcohol. She reports that she does not use drugs.   Family History: The patient's family history is not on file.   ROS:  Please see the history of present illness. Otherwise, complete review of systems is positive for none.  All other systems are reviewed and negative.   Physical Exam: VS:  BP 122/74   Pulse (!) 59   Ht '5\' 6"'$  (1.676 m)   Wt 162 lb 3.2 oz (73.6 kg)   SpO2 96%   BMI 26.18 kg/m , BMI Body mass index is 26.18 kg/m.  Wt Readings from Last 3 Encounters:  10/30/22 162 lb 3.2 oz (73.6 kg)  10/11/22 162 lb 1.3 oz (73.5 kg)  07/30/22 176 lb (79.8 kg)    General: Patient appears comfortable at rest. HEENT: Conjunctiva and lids normal, oropharynx clear with moist mucosa. Neck: Supple, no elevated JVP or carotid bruits, no thyromegaly. Lungs: Clear to auscultation, nonlabored breathing at rest. Cardiac: Regular rate and rhythm, no S3 or significant systolic murmur, no pericardial rub. Abdomen: Soft, nontender, no hepatomegaly, bowel sounds present, no  guarding or rebound. Extremities: No pitting edema, distal pulses 2+. Skin: Warm and dry. Musculoskeletal: No kyphosis. Neuropsychiatric: Alert and oriented x3, affect grossly appropriate.  ECG:  NSR  Recent Labwork: 07/11/2022: ALT 12; AST 22; BUN 20; Creatinine, Ser 1.00; Hemoglobin 12.6; Platelets 166; Potassium 3.9; Sodium 145; TSH 2.850     Component Value Date/Time   CHOL 169 07/11/2022 1039   TRIG 45 07/11/2022 1039   HDL 98 07/11/2022 1039   CHOLHDL 1.7 07/11/2022 1039   [place] 61 07/11/2022 1039    Other Studies Reviewed Today:   Assessment and Plan: Patient is a  62 year old F known to have bipolar disorder was referred to cardiology clinic for evaluation of chest pain.  # Atypical chest pain # DOE -Patient started to have chest pain lasting for 5 to second 10 seconds since the beginning of this year, occurs daily, no relation with rest or exercise and resolves spontaneously. She also started to have DOE when climbing stairs/going uphill since the beginning of this year. Obtain NM stress test and 2D echocardiogram  # Sinus bradycardia, HR 56 bpm on EKG -Currently on donepezil which might be causing bradycardia but has no symptoms.  Will monitor.  I have spent a total of 45 minutes with patient reviewing chart, EKGs, labs and examining patient as well as establishing an assessment and plan that was discussed with the patient.  > 50% of time was spent in direct patient care.      Medication Adjustments/Labs and Tests Ordered: Current medicines are reviewed at length with the patient today.  Concerns regarding medicines are outlined above.   Tests Ordered: Orders Placed This Encounter  Procedures   NM Myocar Multi W/Spect W/Wall Motion / EF   EKG 12-Lead   ECHOCARDIOGRAM COMPLETE    Medication Changes: No orders of the defined types were placed in this encounter.   Disposition:  Follow up prn  Signed Kedrick Mcnamee Fidel Levy, MD, 10/31/2022 11:59 AM    [facility], [place], [place] [address]

## 2022-11-04 ENCOUNTER — Encounter (HOSPITAL_COMMUNITY): Payer: Self-pay

## 2022-11-04 ENCOUNTER — Encounter (HOSPITAL_COMMUNITY): Payer: Medicaid Other

## 2022-11-11 ENCOUNTER — Encounter (HOSPITAL_COMMUNITY): Admission: RE | Admit: 2022-11-11 | Payer: Medicaid Other | Source: Ambulatory Visit

## 2022-11-11 ENCOUNTER — Encounter (HOSPITAL_COMMUNITY): Payer: Medicaid Other

## 2022-11-12 ENCOUNTER — Other Ambulatory Visit: Payer: Self-pay | Admitting: Family Medicine

## 2022-11-12 DIAGNOSIS — F3131 Bipolar disorder, current episode depressed, mild: Secondary | ICD-10-CM

## 2022-11-13 ENCOUNTER — Other Ambulatory Visit: Payer: Self-pay | Admitting: Family Medicine

## 2022-11-15 ENCOUNTER — Other Ambulatory Visit (HOSPITAL_COMMUNITY): Payer: Medicaid Other

## 2022-11-15 ENCOUNTER — Encounter (HOSPITAL_COMMUNITY): Payer: Medicaid Other

## 2022-11-19 ENCOUNTER — Ambulatory Visit: Payer: Medicaid Other | Attending: Internal Medicine

## 2022-11-19 DIAGNOSIS — R0609 Other forms of dyspnea: Secondary | ICD-10-CM

## 2022-11-20 LAB — ECHOCARDIOGRAM COMPLETE
AR max vel: 1.99 cm2
AV Area VTI: 2.07 cm2
AV Area mean vel: 2.16 cm2
AV Mean grad: 4 mmHg
AV Peak grad: 8.9 mmHg
AV Vena cont: 0.4 cm
Ao pk vel: 1.5 m/s
Area-P 1/2: 3.74 cm2
Calc EF: 67.4 %
MV M vel: 3.86 m/s
MV Peak grad: 59.4 mmHg
P 1/2 time: 1030 msec
S' Lateral: 2.3 cm
Single Plane A2C EF: 69.2 %
Single Plane A4C EF: 66 %

## 2022-11-27 ENCOUNTER — Other Ambulatory Visit: Payer: Self-pay | Admitting: Family Medicine

## 2022-12-09 ENCOUNTER — Other Ambulatory Visit: Payer: Self-pay | Admitting: Family Medicine

## 2022-12-09 DIAGNOSIS — F3131 Bipolar disorder, current episode depressed, mild: Secondary | ICD-10-CM

## 2022-12-17 ENCOUNTER — Other Ambulatory Visit: Payer: Self-pay | Admitting: Family Medicine

## 2022-12-17 DIAGNOSIS — G4700 Insomnia, unspecified: Secondary | ICD-10-CM

## 2022-12-19 ENCOUNTER — Telehealth: Payer: Self-pay | Admitting: *Deleted

## 2022-12-19 NOTE — Telephone Encounter (Signed)
-----   Message from Marjo Bicker, MD sent at 11/11/2022  4:25 PM EDT ----- Regarding: RE: [place] Exercise stress ECG can also be done but sensitivity is less compared to Nuclear stress test.  ----- Message ----- From: Francine Graven Sent: 11/11/2022   3:33 PM EDT To: Eustace Moore, RN; Mohammed Kindle; # Subject: AUTH DENIAL                                    Good afternoon,  [employer] has denied pt's auth. Without prior imaging results they've asked for, it was denied. Malanie, are you able to take this appt of the schedule until we find a resolve. Following are details of denial.  We asked your provider to send [place] more information to help [place] approve your request. Your  provider did not send [place] the information.  On 11/08/2022, we asked your provider for the following important facts or documents: a  doctor's note with a reason why a heart test where you walk (Exercise Stress Test) without  heart pictures cannot be done.  Without this additional information, your request did not meet criteria for approval found  in [place]IA Clinical Guideline 024 for Myocardial Perfusion Imaging.   Thanks, [employer]

## 2022-12-20 NOTE — Telephone Encounter (Signed)
Per Mallipeddi and echo result-keep one year plan with echo prior to the next clinic visit.

## 2022-12-20 NOTE — Telephone Encounter (Signed)
Contacted to offer Calcium Score CT or GXT and patient denied at this time. Says she no longer has chest pain and declines further testing at this time. Advised that a recall has been placed for her 1 year follow up echo and visit. Advised if she develops chest pain again, to contact office for a sooner appointment. Verbalized understanding.

## 2022-12-24 ENCOUNTER — Other Ambulatory Visit: Payer: Self-pay | Admitting: Family Medicine

## 2023-01-03 ENCOUNTER — Other Ambulatory Visit: Payer: Self-pay | Admitting: Family Medicine

## 2023-01-03 DIAGNOSIS — F3131 Bipolar disorder, current episode depressed, mild: Secondary | ICD-10-CM

## 2023-01-10 ENCOUNTER — Ambulatory Visit (INDEPENDENT_AMBULATORY_CARE_PROVIDER_SITE_OTHER): Payer: Medicaid Other | Admitting: Family Medicine

## 2023-01-10 ENCOUNTER — Encounter: Payer: Self-pay | Admitting: Family Medicine

## 2023-01-10 VITALS — BP 125/72 | HR 61 | Ht 66.0 in | Wt 157.1 lb

## 2023-01-10 DIAGNOSIS — E7849 Other hyperlipidemia: Secondary | ICD-10-CM

## 2023-01-10 DIAGNOSIS — E039 Hypothyroidism, unspecified: Secondary | ICD-10-CM

## 2023-01-10 DIAGNOSIS — R7301 Impaired fasting glucose: Secondary | ICD-10-CM | POA: Diagnosis not present

## 2023-01-10 DIAGNOSIS — E559 Vitamin D deficiency, unspecified: Secondary | ICD-10-CM

## 2023-01-10 DIAGNOSIS — F4010 Social phobia, unspecified: Secondary | ICD-10-CM | POA: Diagnosis not present

## 2023-01-10 DIAGNOSIS — M79641 Pain in right hand: Secondary | ICD-10-CM | POA: Diagnosis not present

## 2023-01-10 DIAGNOSIS — F3131 Bipolar disorder, current episode depressed, mild: Secondary | ICD-10-CM | POA: Diagnosis not present

## 2023-01-10 NOTE — Progress Notes (Signed)
Established Patient Office Visit  Subjective:  Patient ID: Anita Berry, female    DOB: [birth year]  Age: 62 y.o. MRN: [id number]  CC:  Chief Complaint  Patient presents with   Chronic Care Management    3 month f/u, pt would like to discuss extending follow up visits.    Hand Problem    Pt reports right hand concerns on her thumb she has a knot that she noticed on 12/11/2022.     HPI Anita Berry is a 62 y.o. female with past medical history of GERD, hypothyroidism, bipolar 1 presents for f/u of  chronic medical conditions. For the details of today's visit, please refer to the assessment and plan.     Past Medical History:  Diagnosis Date   Bipolar 1 disorder ([facility])    Bipolar disorder, unspecified ([facility])    Dementia ([facility])    Thyroid disease     Past Surgical History:  Procedure Laterality Date   CHOLECYSTECTOMY     SHOULDER SURGERY Right    TONSILLECTOMY      History reviewed. No pertinent family history.  Social History   Socioeconomic History   Marital status: Single    Spouse name: Not on file   Number of children: 0   Years of education: Not on file   Highest education level: Not on file  Occupational History   Occupation: Retired    Comment: Naval architect- consulting  Tobacco Use   Smoking status: Never   Smokeless tobacco: Never  Building services engineer Use: Never used  Substance and Sexual Activity   Alcohol use: Not Currently    Comment: none recently   Drug use: Never   Sexual activity: Not Currently  Other Topics Concern   Not on file  Social History Narrative   Never been married; boyfriend when she was 14 committed suicide, and she hasn't had solid relationship since   Social Determinants of Health   Financial Resource Strain: Not on file  Food Insecurity: Not on file  Transportation Needs: Not on file  Physical Activity: Not on file  Stress: Not on file  Social Connections: Not on file  Intimate Partner Violence: Not on file     Outpatient Medications Prior to Visit  Medication Sig Dispense Refill   Calcium Carb-Cholecalciferol (CALTRATE 600+D3) 600-20 MG-MCG TABS Take 600 mg by mouth 2 (two) times daily. 60 tablet 3   Cholecalciferol (VITAMIN D3) 25 MCG (1000 UT) CAPS Take 1 capsule (1,000 Units total) by mouth daily. 60 capsule 3   donepezil (ARICEPT) 5 MG tablet Take 1 tablet by mouth once daily 90 tablet 0   Doxepin HCl 5 % CREA Apply a thin film 4 times/day with at least 3- to 4-hour interval between applications; not recommended for use >8 days. 30 g 0   levothyroxine (SYNTHROID) 50 MCG tablet Take 1 tablet by mouth once daily 90 tablet 0   omeprazole (PRILOSEC) 20 MG capsule Take 1 capsule by mouth once daily 90 capsule 0   traZODone (DESYREL) 100 MG tablet TAKE 1 TABLET BY MOUTH AT BEDTIME 90 tablet 0   venlafaxine (EFFEXOR) 75 MG tablet Take 1 tablet by mouth twice daily 60 tablet 0   No facility-administered medications prior to visit.    Allergies  Allergen Reactions   Prednisone Other (See Comments)    Kept her up and sent her into a manic mood    Sulfa Antibiotics Swelling    ROS Review of Systems  Constitutional:  Negative for chills and fever.  Eyes:  Negative for visual disturbance.  Respiratory:  Negative for chest tightness and shortness of breath.   Cardiovascular:  Negative for leg swelling.  Neurological:  Negative for dizziness and headaches.  Psychiatric/Behavioral:  Negative for suicidal ideas.       Objective:    Physical Exam HENT:     Head: Normocephalic.     Mouth/Throat:     Mouth: Mucous membranes are moist.  Cardiovascular:     Rate and Rhythm: Normal rate.     Heart sounds: Normal heart sounds.  Pulmonary:     Effort: Pulmonary effort is normal.     Breath sounds: Normal breath sounds.  Musculoskeletal:     Comments: Prominent trapezoid bone of the right hand with no signs of inflammation noted.  Neurological:     Mental Status: She is alert.     BP  125/72   Pulse 61   Ht 5\' 6"  (1.676 m)   Wt 157 lb 1.9 oz (71.3 kg)   SpO2 98%   BMI 25.36 kg/m  Wt Readings from Last 3 Encounters:  01/10/23 157 lb 1.9 oz (71.3 kg)  10/30/22 162 lb 3.2 oz (73.6 kg)  10/11/22 162 lb 1.3 oz (73.5 kg)    Lab Results  Component Value Date   TSH 2.850 07/11/2022   Lab Results  Component Value Date   WBC 4.3 07/11/2022   HGB 12.6 07/11/2022   HCT 37.8 07/11/2022   MCV 92 07/11/2022   PLT 166 07/11/2022   Lab Results  Component Value Date   NA 145 (H) 07/11/2022   K 3.9 07/11/2022   CO2 22 07/11/2022   GLUCOSE 109 (H) 07/11/2022   BUN 20 07/11/2022   CREATININE 1.00 07/11/2022   BILITOT 0.4 07/11/2022   ALKPHOS 126 (H) 07/11/2022   AST 22 07/11/2022   ALT 12 07/11/2022   PROT 6.0 07/11/2022   ALBUMIN 4.1 07/11/2022   CALCIUM 8.6 (L) 07/11/2022   EGFR 63 07/11/2022   Lab Results  Component Value Date   CHOL 169 07/11/2022   Lab Results  Component Value Date   HDL 98 07/11/2022   Lab Results  Component Value Date   LDLCALC 61 07/11/2022   Lab Results  Component Value Date   TRIG 45 07/11/2022   Lab Results  Component Value Date   CHOLHDL 1.7 07/11/2022   Lab Results  Component Value Date   HGBA1C 5.6 07/11/2022      Assessment & Plan:  Social anxiety disorder Assessment & Plan: GAD-7 is 63 PHQ-9 is 13 Reports history of social anxiety that has worsened in the past few years She reports intense anxiety when around people, noting difficulty communicating No suicidal thoughts or ideation reported She is currently taking venlafaxine 75 mg twice daily Place a referral   to integrated behavioral health for talk therapy  Patient  verbally consented to [facility] services about presenting concerns and psychiatric consultation as appropriate.  The services will be billed as appropriate for the patient.      Orders: -     Amb ref to Integrated Behavioral Health  Right hand  pain Assessment & Plan: The patient is predominantly right-handed She reports pain along the radial hand with decreased fine motor skills a few weeks ago Pain has subsided since  No tenderness to palpation, redness or warmth noted today Negative for frankelstein test She will be following up with orthopedics surgery in 3 weeks  for her left hand Declines occupational therapy at the moment Will wait to be evaluated by her orthopedics   Bipolar affective disorder, currently depressed, mild (HCC) -     Amb ref to Integrated Behavioral Health  IFG (impaired fasting glucose) -     Hemoglobin A1c  Vitamin D deficiency -     VITAMIN D 25 Hydroxy (Vit-D Deficiency, Fractures)  Hypothyroidism, unspecified type -     TSH + free T4  Other hyperlipidemia -     Lipid panel -     CMP14+EGFR -     CBC with Differential/Platelet    Follow-up: Return in about 4 months (around 05/13/2023).   Gilmore Laroche, FNP

## 2023-01-10 NOTE — Assessment & Plan Note (Addendum)
GAD-7 is 19 PHQ-9 is 13 Reports history of social anxiety that has worsened in the past few years She reports intense anxiety when around people, noting difficulty communicating No suicidal thoughts or ideation reported She is currently taking venlafaxine 75 mg twice daily Place a referral   to integrated behavioral health for talk therapy  Patient  verbally consented to [facility] services about presenting concerns and psychiatric consultation as appropriate.  The services will be billed as appropriate for the patient.

## 2023-01-10 NOTE — Assessment & Plan Note (Addendum)
The patient is predominantly right-handed She reports pain along the radial hand with decreased fine motor skills a few weeks ago Pain has subsided since  No tenderness to palpation, redness or warmth noted today Negative for frankelstein test She will be following up with orthopedics surgery in 3 weeks for her left hand Declines occupational therapy at the moment Will wait to be evaluated by her orthopedics

## 2023-01-10 NOTE — Patient Instructions (Addendum)
I appreciate the opportunity to provide care to you today!    Follow up:  4 months  Labs: please stop by the lab today to get your blood drawn (CBC, CMP, TSH, Lipid profile, HgA1c, Vit D)   Referrals today-integrated behavioral health for talk therapy with Esmond Harps    Please continue to a heart-healthy diet and increase your physical activities. Try to exercise for at least five days a week.      It was a pleasure to see you and I look forward to continuing to work together on your health and well-being. Please do not hesitate to call the office if you need care or have questions about your care.   Have a wonderful day and week. With Gratitude, Gilmore Laroche MSN, FNP-BC

## 2023-01-14 LAB — HEMOGLOBIN A1C
Est. average glucose Bld gHb Est-mCnc: 114 mg/dL
Hgb A1c MFr Bld: 5.6 % (ref 4.8–5.6)

## 2023-01-14 LAB — CMP14+EGFR
ALT: 10 IU/L (ref 0–32)
AST: 22 IU/L (ref 0–40)
Albumin/Globulin Ratio: 2.5 — ABNORMAL HIGH (ref 1.2–2.2)
Albumin: 4.3 g/dL (ref 3.9–4.9)
Alkaline Phosphatase: 143 IU/L — ABNORMAL HIGH (ref 44–121)
BUN/Creatinine Ratio: 13 (ref 12–28)
BUN: 15 mg/dL (ref 8–27)
Bilirubin Total: 0.5 mg/dL (ref 0.0–1.2)
CO2: 19 mmol/L — ABNORMAL LOW (ref 20–29)
Calcium: 9.1 mg/dL (ref 8.7–10.3)
Chloride: 112 mmol/L — ABNORMAL HIGH (ref 96–106)
Creatinine, Ser: 1.16 mg/dL — ABNORMAL HIGH (ref 0.57–1.00)
Globulin, Total: 1.7 g/dL (ref 1.5–4.5)
Glucose: 102 mg/dL — ABNORMAL HIGH (ref 70–99)
Potassium: 4.1 mmol/L (ref 3.5–5.2)
Sodium: 143 mmol/L (ref 134–144)
Total Protein: 6 g/dL (ref 6.0–8.5)
eGFR: 53 mL/min/{1.73_m2} — ABNORMAL LOW (ref >59)

## 2023-01-14 LAB — CBC WITH DIFFERENTIAL/PLATELET
Basophils Absolute: 0 10*3/uL (ref 0.0–0.2)
Basos: 1 %
EOS (ABSOLUTE): 0.1 10*3/uL (ref 0.0–0.4)
Eos: 1 %
Hematocrit: 37.7 % (ref 34.0–46.6)
Hemoglobin: 13 g/dL (ref 11.1–15.9)
Immature Grans (Abs): 0 10*3/uL (ref 0.0–0.1)
Immature Granulocytes: 0 %
Lymphocytes Absolute: 0.9 10*3/uL (ref 0.7–3.1)
Lymphs: 15 %
MCH: 31.9 pg (ref 26.6–33.0)
MCHC: 34.5 g/dL (ref 31.5–35.7)
MCV: 93 fL (ref 79–97)
Monocytes Absolute: 0.3 10*3/uL (ref 0.1–0.9)
Monocytes: 5 %
Neutrophils Absolute: 4.6 10*3/uL (ref 1.4–7.0)
Neutrophils: 78 %
Platelets: 194 10*3/uL (ref 150–450)
RBC: 4.07 x10E6/uL (ref 3.77–5.28)
RDW: 12.5 % (ref 11.7–15.4)
WBC: 5.9 10*3/uL (ref 3.4–10.8)

## 2023-01-14 LAB — LIPID PANEL
Chol/HDL Ratio: 1.8 ratio (ref 0.0–4.4)
Cholesterol, Total: 203 mg/dL — ABNORMAL HIGH (ref 100–199)
HDL: 110 mg/dL (ref >39)
LDL Chol Calc (NIH): 82 mg/dL (ref 0–99)
Triglycerides: 61 mg/dL (ref 0–149)
VLDL Cholesterol Cal: 11 mg/dL (ref 5–40)

## 2023-01-14 LAB — TSH+FREE T4
Free T4: 1.16 ng/dL (ref 0.82–1.77)
TSH: 1.6 u[IU]/mL (ref 0.450–4.500)

## 2023-01-14 LAB — VITAMIN D 25 HYDROXY (VIT D DEFICIENCY, FRACTURES): Vit D, 25-Hydroxy: 34.8 ng/mL (ref 30.0–100.0)

## 2023-01-14 NOTE — Progress Notes (Signed)
   Please inform the patient that her total cholesterol is elevated. I recommend avoiding simple carbohydrates, including cakes, sweet desserts, ice cream, soda (diet or regular), sweet tea, candies, chips, cookies, store-bought juices, alcohol in excess of 1-2 drinks a day, lemonade, artificial sweeteners, donuts, coffee creamers, and sugar-free products.  I recommend avoiding greasy, fatty foods with increased physical activity. Please encourage the patient to increase her fluid intake to at least 64 ounces daily. All labs indicated inadequate fluid consumption. Will reassess her labs at her next visit

## 2023-01-28 ENCOUNTER — Other Ambulatory Visit: Payer: Self-pay | Admitting: Family Medicine

## 2023-01-28 DIAGNOSIS — K219 Gastro-esophageal reflux disease without esophagitis: Secondary | ICD-10-CM

## 2023-01-30 ENCOUNTER — Ambulatory Visit (INDEPENDENT_AMBULATORY_CARE_PROVIDER_SITE_OTHER): Payer: Medicaid Other | Admitting: Professional Counselor

## 2023-01-30 DIAGNOSIS — F3131 Bipolar disorder, current episode depressed, mild: Secondary | ICD-10-CM

## 2023-01-30 DIAGNOSIS — F4010 Social phobia, unspecified: Secondary | ICD-10-CM

## 2023-01-30 NOTE — BH Specialist Note (Addendum)
Collaborative Care Initial Assessment  Session Start time: 1:00 pm Session End time: 2:00 pm Total time in minutes: 60 min  Type of Contact:  Face to Face Patient consent obtained:  Yes or No Types of Service: Collaborative care  Summary   Reason for referral in patient/family's own words:  "Anxiety"  Patient's goal for today's visit: "I was hoping to tell someone everything I got going on and get some help"  History of Present illness:   Anita Berry is a 62 y.o.female with a history of depression and anxiety. Patient's chief complaint is feeling extreme anxiousness around people. Patient reports she does not like being out in public and stays in the house. Patient reports she use to be more social and outgoing in the past. Patient reports when she was in high school her boyfriend committed suicide and she was blamed for the suicide. Patient reports her fear of people stemmed from this and other issues involving people being mean to her. She reports being bullied a lot as a kid about her weight. However, despite these early traumas and symptoms, she went on to function well for many years in her adulthood. Patient reports her symptoms began to present again in Feb 04, 2013. She reports being her mother and father's primary caregivers in which she primarily interacted with them and not many other people. Her father passed away in Feb 05, 2008 and her mother passed away in 02-04-2013. Patient reports that after her mother death she has had persisting social anxiety. Patient reports when she is home with her animals she is okay. Patient reports she experiences heightened anxiety when she goes out in public and around people. Patient reports "I was in a local restaurant the other day talking to the owner and started getting nervous, started hearing the buzzing in my head, started tripping over my words and felt panicked" She reports not understanding why she feels this way. Patient reports depressive episodes lasting a week at a  time then she has episodes of feeling elated for less than a week. Patient reports wanting to enjoy life again.   Social History:  Household: Lives with sister and is primary caregiver to her as she has MS Marital status: Never married Number of Children: None Employment: Retired/ but on disability after her mom passed. Education: Engineer, maintenance (IT)  Psychiatric Review of systems: Insomnia: Denies Changes in appetite: Denies Decreased need for sleep: Denies Family history of bipolar disorder: No Hallucinations: No   Paranoia: No    Traumatic Experiences: History or current traumatic events (natural disaster, house fire, etc.)? no History or current physical trauma?  no History or current emotional trauma?  yes, bullying. Patient reports being bullied "horribly" as a youth. And reports "it really messed me up bad" History or current sexual trauma?  no History or current domestic or intimate partner violence?  no PTSD symptoms if any traumatic experiences yes, Patient reports experiencing flashbacks, unwanted memories, chest tightness, "loud buzzing in ear"  Alcohol and/or Substance Use History   Tobacco Alcohol Other substances  Current use Never None currently. Patient reports binge drinking for 4 days. Drinking 4 12 packs of beer. Prior to this she drinks every October being her [employer] day. Binge drinks a couple of 12 packs of days then stops.  Patient reports using thc 3-4 times a week using thc gummies. Patient reports being abstained from all other substances for 30 years.   Past use Never Patient reports sporadic drinking in her younger years.  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     Psychiatric History: Past psychiatry diagnosis: Bipolar 1 from a psychiatrist 24 years ago.  Patient currently being seen by  therapist/psychiatrist:  No Prior Suicide Attempts: Denies Past psychiatry Hospitalization(s): Patient reports being hospitalized twice due suicidal thoughts. Patient reports around 1999 took LSD was awake for a week and taken to ED for stomach pumping. The next time she had a head injury and endorsed suicidal thoughts was admitted.  Past history of violence: None  Psychotropic medications: Current medications: Effexor and Trazodone Patient taking medications as prescribed:  Yes  Side effects reported: Been taking Effexor for 25 years.  Current medications (medication list) Current Outpatient Medications on File Prior to Visit  Medication Sig Dispense Refill   Calcium Carb-Cholecalciferol (CALTRATE 600+D3) 600-20 MG-MCG TABS Take 600 mg by mouth 2 (two) times daily. 60 tablet 3   Cholecalciferol (VITAMIN D3) 25 MCG (1000 UT) CAPS Take 1 capsule (1,000 Units total) by mouth daily. 60 capsule 3   donepezil (ARICEPT) 5 MG tablet Take 1 tablet by mouth once daily 90 tablet 0   Doxepin HCl 5 % CREA Apply a thin film 4 times/day with at least 3- to 4-hour interval between applications; not recommended for use >8 days. 30 g 0   levothyroxine (SYNTHROID) 50 MCG tablet Take 1 tablet by mouth once daily 90 tablet 0   omeprazole (PRILOSEC) 20 MG capsule Take 1 capsule by mouth once daily 90 capsule 0   traZODone (DESYREL) 100 MG tablet TAKE 1 TABLET BY MOUTH AT BEDTIME 90 tablet 0   venlafaxine (EFFEXOR) 75 MG tablet Take 1 tablet by mouth twice daily 60 tablet 0   No current facility-administered medications on file prior to visit.     Mental status exam:   General Appearance Anita Berry:  Neat Eye Contact:  Good Motor Behavior:  Normal Speech:  Normal Level of Consciousness:  Alert Mood:  Anxious Affect:  Appropriate Anxiety Level:  None Thought Process:  Coherent Thought Content:  WNL Perception:  Normal Judgment:  Good Insight:  Present   Clinical Assessment   PHQ-9 Assessments:     01/30/2023    1:29 PM 01/10/2023   11:09 AM 10/11/2022   10:29 AM 10/11/2022   10:24 AM 08/09/2022    4:29 PM  Depression screen PHQ 2/9  Decreased Interest 3 2 0 0 0  Down, Depressed, Hopeless 1 2 0 0 0  PHQ - 2 Score 4 4 0 0 0  Altered sleeping 3 2 3 3  0  Tired, decreased energy 0 0 0 0 0  Change in appetite 1 0 0 0 0  Feeling bad or failure about yourself  2 3 0 0 0  Trouble concentrating 0 3 3 3  0  Moving slowly or fidgety/restless 3 1 0 0 0  Suicidal thoughts 0 0 0 0 0  PHQ-9 Score 13 13 6 6  0  Difficult doing work/chores Extremely dIfficult Not difficult at all Not difficult at all Not difficult at all Not difficult at all    GAD-7 Assessments:    01/30/2023    1:30 PM 01/10/2023   11:09 AM 10/11/2022   10:30 AM 10/11/2022   10:25 AM  GAD 7 : Generalized Anxiety Score  Nervous, Anxious, on Edge 3 3 1  1  Control/stop worrying 3 3 0 0  Worry too much - different things 3 3 0 0  Trouble relaxing 3 3 0 0  Restless 3 2 1 1   Easily annoyed or irritable 3 3 2 2   Afraid - awful might happen 1 2 1 1   Total GAD 7 Score 19 19 5 5   Anxiety Difficulty Extremely difficult Very difficult Not difficult at all Not difficult at all     Self-harm Behaviors Risk Assessment Self-harm risk factors:  Past thoughts Patient endorses recent thoughts of harming self:  Patient reports not being afraid to die but Denies any intent or plan to harm self. [place] Suicide Severity Rating Scale:   Guns in the home:  Denies   Protective factors: Patient reports "I love life and I want to live and I love my animals"  Danger to Others Risk Assessment Danger to others risk factors:  Denies Patient endorses recent thoughts of harming others: Denies Dynamic Appraisal of Situational Aggression (DASA):   BH Counselor discussed emergency crisis plan with client and provided local emergency services resources.  Diagnosis: Social Anxiety  Goals: "I want to socialize more, meet more friends, feel  comfortable in public"   Interventions: Mindfulness or Relaxation Training, Behavioral Activation, and CBT Cognitive Behavioral Therapy   Follow-up Plan: [facility]  [place], [place]

## 2023-01-30 NOTE — Patient Instructions (Addendum)
Practice mindfulness when activated.  Review intro to CBT to begin education on how thoughts, emotions and behaviors interact.  If your symptoms worsen or you have thoughts of suicide/homicide, PLEASE SEEK IMMEDIATE MEDICAL ATTENTION.  You may always call:   National Suicide Hotline: 988 or (769)566-0547 [place] Crisis Line: 717-394-2077 Crisis Recovery in [place]: [phone]     These are available 24 hours a day, 7 days a week.

## 2023-02-01 ENCOUNTER — Other Ambulatory Visit: Payer: Self-pay | Admitting: Family Medicine

## 2023-02-01 DIAGNOSIS — F3131 Bipolar disorder, current episode depressed, mild: Secondary | ICD-10-CM

## 2023-02-07 ENCOUNTER — Other Ambulatory Visit: Payer: Self-pay | Admitting: Family Medicine

## 2023-02-19 ENCOUNTER — Telehealth (INDEPENDENT_AMBULATORY_CARE_PROVIDER_SITE_OTHER): Payer: Medicaid Other | Admitting: Professional Counselor

## 2023-02-19 DIAGNOSIS — F3131 Bipolar disorder, current episode depressed, mild: Secondary | ICD-10-CM

## 2023-02-19 NOTE — BH Specialist Note (Unsigned)
Virtual Behavioral Health Treatment Plan Team Note  MRN: [id number] NAME: Anita Berry  DATE: 02/20/23  Start time: Start Time: 0217 End time: Stop Time: 0234 Total time: Total Time in Minutes (Visit): 17  Total number of Virtual BH Treatment Team Plan encounters: 1/4  Treatment Team Attendees: Dr. Vanetta Shawl and Esmond Harps  Diagnoses:    ICD-10-CM   1. Bipolar affective disorder, currently depressed, mild (HCC)  F31.31       Goals, Interventions and Follow-up Plan Goals: Increase social activity and decrease social anxiety Interventions: Mindfulness or Relaxation Training Behavioral Activation CBT Cognitive Behavioral Therapy Recommendation No change in medication  BH specialist to follow up weekly for CBT and further evaluation.  History of the present illness Presenting Problem/Current Symptoms: Anita Berry is a 62 y.o.female with a history of depression and anxiety. Patient's chief complaint is feeling extreme anxiousness around people. Patient reports she does not like being out in public and stays in the house. Patient reports she use to be more social and outgoing in the past. Patient reports when she was in high school her boyfriend committed suicide and she was blamed for the suicide. Patient reports her fear of people stemmed from this and other issues involving people being mean to her. She reports being bullied a lot as a kid about her weight. However, despite these early traumas and symptoms, she went on to function well for many years in her adulthood. Patient reports her symptoms began to present again in Mar 13, 2013. She reports being her mother and father's primary caregivers in which she primarily interacted with them and not many other people. Her father passed away in 2008/03/13 and her mother passed away in 2013-03-13. Patient reports that after her mother death she has had persisting social anxiety. Patient reports when she is home with her animals she is okay. Patient reports she experiences  heightened anxiety when she goes out in public and around people. Patient reports "I was in a local restaurant the other day talking to the owner and started getting nervous, started hearing the buzzing in my head, started tripping over my words and felt panicked" She reports not understanding why she feels this way. Patient reports depressive episodes lasting a week at a time then she has episodes of feeling elated for less than a week. Patient reports wanting to enjoy life again.     Screenings PHQ-9 Assessments:     01/30/2023    1:29 PM 01/10/2023   11:09 AM 10/11/2022   10:29 AM  Depression screen PHQ 2/9  Decreased Interest 3 2 0  Down, Depressed, Hopeless 1 2 0  PHQ - 2 Score 4 4 0  Altered sleeping 3 2 3   Tired, decreased energy 0 0 0  Change in appetite 1 0 0  Feeling bad or failure about yourself  2 3 0  Trouble concentrating 0 3 3  Moving slowly or fidgety/restless 3 1 0  Suicidal thoughts 0 0 0  PHQ-9 Score 13 13 6   Difficult doing work/chores Extremely dIfficult Not difficult at all Not difficult at all   GAD-7 Assessments:     01/30/2023    1:30 PM 01/10/2023   11:09 AM 10/11/2022   10:30 AM 10/11/2022   10:25 AM  GAD 7 : Generalized Anxiety Score  Nervous, Anxious, on Edge 3 3 1 1   Control/stop worrying 3 3 0 0  Worry too much - different things 3 3 0 0  Trouble relaxing 3 3 0 0  Restless 3  2 1 1   Easily annoyed or irritable 3 3 2 2   Afraid - awful might happen 1 2 1 1   Total GAD 7 Score 19 19 5 5   Anxiety Difficulty Extremely difficult Very difficult Not difficult at all Not difficult at all    Past Medical History Past Medical History:  Diagnosis Date   Bipolar 1 disorder (HCC)    Bipolar disorder, unspecified (HCC)    Dementia (HCC)    Thyroid disease     Vital signs: There were no vitals filed for this visit.  Allergies:  Allergies as of 02/19/2023 - Review Complete 01/10/2023  Allergen Reaction Noted   Prednisone Other (See Comments) 01/08/2022    Sulfa antibiotics Swelling 06/29/2020    Medication History Current medications:  Outpatient Encounter Medications as of 02/19/2023  Medication Sig   Calcium Carb-Cholecalciferol (CALTRATE 600+D3) 600-20 MG-MCG TABS Take 600 mg by mouth 2 (two) times daily.   Cholecalciferol (VITAMIN D3) 25 MCG (1000 UT) CAPS Take 1 capsule (1,000 Units total) by mouth daily.   donepezil (ARICEPT) 5 MG tablet Take 1 tablet by mouth once daily   Doxepin HCl 5 % CREA Apply a thin film 4 times/day with at least 3- to 4-hour interval between applications; not recommended for use >8 days.   levothyroxine (SYNTHROID) 50 MCG tablet Take 1 tablet by mouth once daily   omeprazole (PRILOSEC) 20 MG capsule Take 1 capsule by mouth once daily   traZODone (DESYREL) 100 MG tablet TAKE 1 TABLET BY MOUTH AT BEDTIME   venlafaxine (EFFEXOR) 75 MG tablet Take 1 tablet by mouth twice daily   No facility-administered encounter medications on file as of 02/19/2023.     Scribe for Treatment Team: Reuel Boom

## 2023-02-20 ENCOUNTER — Ambulatory Visit (INDEPENDENT_AMBULATORY_CARE_PROVIDER_SITE_OTHER): Payer: Medicaid Other | Admitting: Professional Counselor

## 2023-02-20 DIAGNOSIS — F3131 Bipolar disorder, current episode depressed, mild: Secondary | ICD-10-CM | POA: Diagnosis not present

## 2023-02-20 NOTE — BH Specialist Note (Signed)
[facility]  Follow-up  MRN: [id number] NAME: Anita Berry Date: 02/21/23  Start time: Start Time: 1000 End time: Stop Time: 1030 Total time: Total Time in Minutes (Visit): 30 Call number: Visit Number: 3- Third Visit  Subjective: Patient is a 62 y.o. female presenting for collaborative care follow up. Patient reports an improvement in her mood. Patient expresses a desire to get into rescuing animals as a hobby. She is interested in horse rescuing and other animals as well. Patient currently takes care of her sisters horses and her own which she reports as therapeutic for her. Patient reports a decrease in her social anxiety and had only two occasions where she became overwhelmed.   [facility] assessed patient further about her bipolar diagnosis. Patient engaged in mood questionnaire and did not meet criteria for a mood disorder scoring only a "2" on the MDQ. Patient and [facility] also discussed her Dementia diagnosis. Patient has been medicated for this disorder and is stable.   [facility] introduced CBT worksheet which discussed the Cognitive model and discussed examples of her automatic thoughts and emotional reactions. [facility] encouraged her to keep thought journal and process in next session. Follow up in 1 week.   PHQ-9 Scores:     02/20/2023   10:12 AM 01/30/2023    1:29 PM 01/10/2023   11:09 AM 10/11/2022   10:29 AM 10/11/2022   10:24 AM  Depression screen PHQ 2/9  Decreased Interest 0 3 2 0 0  Down, Depressed, Hopeless 0 1 2 0 0  PHQ - 2 Score 0 4 4 0 0  Altered sleeping 1 3 2 3 3   Tired, decreased energy 0 0 0 0 0  Change in appetite 0 1 0 0 0  Feeling bad or failure about yourself  0 2 3 0 0  Trouble concentrating 2 0 3 3 3   Moving slowly or fidgety/restless 0 3 1 0 0  Suicidal thoughts 0 0 0 0 0  PHQ-9 Score 3 13 13 6 6   Difficult doing work/chores  Extremely dIfficult Not difficult at all Not difficult at all Not difficult at all   GAD-7 Scores:     02/20/2023   10:13 AM 01/30/2023    1:30 PM  01/10/2023   11:09 AM 10/11/2022   10:30 AM  GAD 7 : Generalized Anxiety Score  Nervous, Anxious, on Edge 1 3 3 1   Control/stop worrying 1 3 3  0  Worry too much - different things 1 3 3  0  Trouble relaxing 1 3 3  0  Restless 3 3 2 1   Easily annoyed or irritable 1 3 3 2   Afraid - awful might happen 1 1 2 1   Total GAD 7 Score 9 19 19 5   Anxiety Difficulty Somewhat difficult Extremely difficult Very difficult Not difficult at all    Stress Current stressors:  Social anxiety Sleep:  Fair Appetite:  Fair Coping ability:  Fair Patient taking medications as prescribed:  Yes  Current medications:  Outpatient Encounter Medications as of 02/20/2023  Medication Sig   Calcium Carb-Cholecalciferol (CALTRATE 600+D3) 600-20 MG-MCG TABS Take 600 mg by mouth 2 (two) times daily.   Cholecalciferol (VITAMIN D3) 25 MCG (1000 UT) CAPS Take 1 capsule (1,000 Units total) by mouth daily.   donepezil (ARICEPT) 5 MG tablet Take 1 tablet by mouth once daily   Doxepin HCl 5 % CREA Apply a thin film 4 times/day with at least 3- to 4-hour interval between applications; not recommended for use >8 days.  levothyroxine (SYNTHROID) 50 MCG tablet Take 1 tablet by mouth once daily   omeprazole (PRILOSEC) 20 MG capsule Take 1 capsule by mouth once daily   traZODone (DESYREL) 100 MG tablet TAKE 1 TABLET BY MOUTH AT BEDTIME   venlafaxine (EFFEXOR) 75 MG tablet Take 1 tablet by mouth twice daily   No facility-administered encounter medications on file as of 02/20/2023.    Self-harm Behaviors Risk Assessment Self-harm risk factors:  Low Risk Patient endorses recent thoughts of harming self:  Denies   Danger to Others Risk Assessment Danger to others risk factors:  Low Risk Patient endorses recent thoughts of harming others:  Denies    Goals, Interventions and Follow-up Plan Goals: Increase healthy adjustment to current life circumstances Interventions: CBT Cognitive Behavioral Therapy Follow-up Plan:  Weekly CBT  counseling  Reuel Boom

## 2023-02-21 NOTE — Patient Instructions (Signed)
Practice CBT worksheet. Process in next session.

## 2023-02-25 ENCOUNTER — Encounter: Payer: Self-pay | Admitting: Orthopaedic Surgery

## 2023-02-25 ENCOUNTER — Ambulatory Visit (INDEPENDENT_AMBULATORY_CARE_PROVIDER_SITE_OTHER): Payer: Medicaid Other | Admitting: Orthopaedic Surgery

## 2023-02-25 DIAGNOSIS — M654 Radial styloid tenosynovitis [de Quervain]: Secondary | ICD-10-CM | POA: Diagnosis not present

## 2023-02-25 MED ORDER — METHYLPREDNISOLONE ACETATE 40 MG/ML IJ SUSP
40.0000 mg | Freq: Once | INTRAMUSCULAR | Status: AC
  Administered 2023-02-25: 40 mg via INTRA_ARTICULAR

## 2023-02-25 NOTE — Progress Notes (Signed)
My wrist is hurting again.  She has recurrence of the first extensor compartment on the left again. I did injection in January.  She has tenderness of the left thumb.  She has no swelling.  Encounter Diagnosis  Name Primary?   De Quervain's tenosynovitis, left Yes   Procedure note: After permission from the patient, the first extensor compartment of the left wrist was prepped.  I instilled 1% xylocaine and 1 cc DepoMedrol 40 into the tendon sheath area by sterile technique tolerated well.  I will see her prn.  Call if any problem.  Precautions discussed.  Electronically Signed Darreld Mclean, MD 7/2/20249:44 AM

## 2023-03-01 ENCOUNTER — Other Ambulatory Visit: Payer: Self-pay | Admitting: Family Medicine

## 2023-03-01 DIAGNOSIS — F3131 Bipolar disorder, current episode depressed, mild: Secondary | ICD-10-CM

## 2023-03-07 ENCOUNTER — Ambulatory Visit (INDEPENDENT_AMBULATORY_CARE_PROVIDER_SITE_OTHER): Payer: Medicaid Other | Admitting: Professional Counselor

## 2023-03-07 DIAGNOSIS — F3131 Bipolar disorder, current episode depressed, mild: Secondary | ICD-10-CM

## 2023-03-07 NOTE — Patient Instructions (Signed)
Continue CBT therapy. Follow up in 2 weeks.

## 2023-03-07 NOTE — BH Specialist Note (Signed)
[facility] Follow-up  MRN: [id number] NAME: Anita Berry Date: 03/07/23  Start time: Start Time: 0900 End time: Stop Time: 0930 Total time: Total Time in Minutes (Visit): 30 Call number: Visit Number: 4- Fourth Visit  Reason for call today: The patient is a 62 year old female presenting for a collaborative care follow-up. She reports feeling "good" and experiencing decreased anxiety as a result of practicing her CBT homework assignments. She is beginning to understand how to challenge her thinking patterns and replace negative thoughts with positive ones. However, she still finds many of her thoughts to be automatic and is struggling to find better outcomes, though she does report some benefits from tracking her thoughts.  The patient also mentions that she frequently clears her throat, which she believes may be a nervous tic. This behavior makes her self-conscious around others, as she worries they might notice it. The behavioral health counselor is engaging her in CBT therapy to challenge her thought processes around this issue, encouraging her to look for evidence to the contrary. Additionally, core beliefs were introduced as a part of CBT, helping her to understand the emotions triggered by these thoughts and how to challenge these beliefs for better behavioral outcomes.  The patient is open to this approach and reports that she will complete the homework assignment. She notes an overall improved mood, as evidenced by consistently decreasing PHQ-9 and GAD-7 scores. A follow-up appointment is scheduled for two weeks.  PHQ-9 Scores:     03/07/2023    9:06 AM 02/20/2023   10:12 AM 01/30/2023    1:29 PM 01/10/2023   11:09 AM 10/11/2022   10:29 AM  Depression screen PHQ 2/9  Decreased Interest 0 0 3 2 0  Down, Depressed, Hopeless 0 0 1 2 0  PHQ - 2 Score 0 0 4 4 0  Altered sleeping 0 1 3 2 3   Tired, decreased energy 0 0 0 0 0  Change in appetite 0 0 1 0 0  Feeling bad or failure about  yourself  0 0 2 3 0  Trouble concentrating 1 2 0 3 3  Moving slowly or fidgety/restless 1 0 3 1 0  Suicidal thoughts 0 0 0 0 0  PHQ-9 Score 2 3 13 13 6   Difficult doing work/chores   Extremely dIfficult Not difficult at all Not difficult at all   GAD-7 Scores:     03/07/2023    9:07 AM 02/20/2023   10:13 AM 01/30/2023    1:30 PM 01/10/2023   11:09 AM  GAD 7 : Generalized Anxiety Score  Nervous, Anxious, on Edge 1 1 3 3   Control/stop worrying 0 1 3 3   Worry too much - different things 1 1 3 3   Trouble relaxing 1 1 3 3   Restless 1 3 3 2   Easily annoyed or irritable 1 1 3 3   Afraid - awful might happen 0 1 1 2   Total GAD 7 Score 5 9 19 19   Anxiety Difficulty Not difficult at all Somewhat difficult Extremely difficult Very difficult    Stress Current stressors:  Car worries Sleep:  Good Appetite:  Good Coping ability:  Good Patient taking medications as prescribed:  Yes  Current medications:  Outpatient Encounter Medications as of 03/07/2023  Medication Sig   Calcium Carb-Cholecalciferol (CALTRATE 600+D3) 600-20 MG-MCG TABS Take 600 mg by mouth 2 (two) times daily.   Cholecalciferol (VITAMIN D3) 25 MCG (1000 UT) CAPS Take 1 capsule (1,000 Units total) by mouth daily.  donepezil (ARICEPT) 5 MG tablet Take 1 tablet by mouth once daily   Doxepin HCl 5 % CREA Apply a thin film 4 times/day with at least 3- to 4-hour interval between applications; not recommended for use >8 days.   levothyroxine (SYNTHROID) 50 MCG tablet Take 1 tablet by mouth once daily   omeprazole (PRILOSEC) 20 MG capsule Take 1 capsule by mouth once daily   traZODone (DESYREL) 100 MG tablet TAKE 1 TABLET BY MOUTH AT BEDTIME   venlafaxine (EFFEXOR) 75 MG tablet Take 1 tablet by mouth twice daily   No facility-administered encounter medications on file as of 03/07/2023.    Self-harm Behaviors Risk Assessment Self-harm risk factors:  Low risk Patient endorses recent thoughts of harming self:  Denies   Danger to  Others Risk Assessment Danger to others risk factors:  Low Patient endorses recent thoughts of harming others:  Denies    Goals, Interventions and Follow-up Plan Goals: Increase healthy adjustment to current life circumstances Interventions: CBT Cognitive Behavioral Therapy Follow-up Plan:  Continue bi-weekly   Reuel Boom

## 2023-03-12 ENCOUNTER — Other Ambulatory Visit: Payer: Self-pay | Admitting: Family Medicine

## 2023-03-12 DIAGNOSIS — G4700 Insomnia, unspecified: Secondary | ICD-10-CM

## 2023-03-29 ENCOUNTER — Other Ambulatory Visit: Payer: Self-pay | Admitting: Family Medicine

## 2023-03-29 DIAGNOSIS — F3131 Bipolar disorder, current episode depressed, mild: Secondary | ICD-10-CM

## 2023-04-29 ENCOUNTER — Other Ambulatory Visit: Payer: Self-pay | Admitting: Family Medicine

## 2023-04-29 DIAGNOSIS — F3131 Bipolar disorder, current episode depressed, mild: Secondary | ICD-10-CM

## 2023-05-08 ENCOUNTER — Other Ambulatory Visit: Payer: Self-pay | Admitting: Family Medicine

## 2023-05-13 ENCOUNTER — Ambulatory Visit: Payer: Medicaid Other | Admitting: Family Medicine

## 2023-05-29 ENCOUNTER — Encounter: Payer: Self-pay | Admitting: Family Medicine

## 2023-05-29 ENCOUNTER — Ambulatory Visit (INDEPENDENT_AMBULATORY_CARE_PROVIDER_SITE_OTHER): Payer: Medicaid Other | Admitting: Family Medicine

## 2023-05-29 VITALS — BP 106/72 | HR 70 | Ht 66.0 in | Wt 159.1 lb

## 2023-05-29 DIAGNOSIS — E559 Vitamin D deficiency, unspecified: Secondary | ICD-10-CM | POA: Diagnosis not present

## 2023-05-29 DIAGNOSIS — H539 Unspecified visual disturbance: Secondary | ICD-10-CM

## 2023-05-29 DIAGNOSIS — R7301 Impaired fasting glucose: Secondary | ICD-10-CM

## 2023-05-29 DIAGNOSIS — E038 Other specified hypothyroidism: Secondary | ICD-10-CM

## 2023-05-29 DIAGNOSIS — E7849 Other hyperlipidemia: Secondary | ICD-10-CM

## 2023-05-29 DIAGNOSIS — Z1231 Encounter for screening mammogram for malignant neoplasm of breast: Secondary | ICD-10-CM

## 2023-05-29 NOTE — Progress Notes (Signed)
Established Patient Office Visit  Subjective:  Patient ID: Anita Berry, female    DOB: 27-Apr-1961  Age: 62 y.o. MRN: [id number]  CC:  Chief Complaint  Patient presents with   Care Management    4 month f/u   Eye Problem    Pt report vision changes with right eye, sees a spot/dot on that side.     HPI Anita Berry is a 62 y.o. female with past medical history of hypothyroidism, visual disturbances presents for f/u of  chronic medical conditions.  Visual Disturbance: The patient denies any recent injury or trauma. There is no redness or loss of vision, but she reports experiencing floaters in the right eye for about 3 days. She denies any blurry vision or diplopia.  Hypothyroidism: The patient is taking Synthroid 50 mcg daily. She does not report any symptoms of fatigue, hair loss, or weight gain.  Past Medical History:  Diagnosis Date   Bipolar 1 disorder (HCC)    Bipolar disorder, unspecified (HCC)    Dementia (HCC)    Thyroid disease     Past Surgical History:  Procedure Laterality Date   CHOLECYSTECTOMY     SHOULDER SURGERY Right    TONSILLECTOMY      History reviewed. No pertinent family history.  Social History   Socioeconomic History   Marital status: Single    Spouse name: Not on file   Number of children: 0   Years of education: Not on file   Highest education level: Not on file  Occupational History   Occupation: Retired    Comment: Naval architect- consulting  Tobacco Use   Smoking status: Never   Smokeless tobacco: Never  Vaping Use   Vaping status: Never Used  Substance and Sexual Activity   Alcohol use: Not Currently    Comment: none recently   Drug use: Never   Sexual activity: Not Currently  Other Topics Concern   Not on file  Social History Narrative   Never been married; boyfriend when she was 14 committed suicide, and she hasn't had solid relationship since   Social Determinants of Health   Financial Resource Strain: Not on file   Food Insecurity: Not on file  Transportation Needs: Not on file  Physical Activity: Not on file  Stress: Not on file  Social Connections: Not on file  Intimate Partner Violence: Not on file    Outpatient Medications Prior to Visit  Medication Sig Dispense Refill   Calcium Carb-Cholecalciferol (CALTRATE 600+D3) 600-20 MG-MCG TABS Take 600 mg by mouth 2 (two) times daily. 60 tablet 3   Cholecalciferol (VITAMIN D3) 25 MCG (1000 UT) CAPS Take 1 capsule (1,000 Units total) by mouth daily. 60 capsule 3   donepezil (ARICEPT) 5 MG tablet Take 1 tablet by mouth once daily 90 tablet 0   Doxepin HCl 5 % CREA Apply a thin film 4 times/day with at least 3- to 4-hour interval between applications; not recommended for use >8 days. 30 g 0   levothyroxine (SYNTHROID) 50 MCG tablet Take 1 tablet by mouth once daily 90 tablet 0   omeprazole (PRILOSEC) 20 MG capsule Take 1 capsule by mouth once daily 90 capsule 0   traZODone (DESYREL) 100 MG tablet TAKE 1 TABLET BY MOUTH AT BEDTIME 90 tablet 0   venlafaxine (EFFEXOR) 75 MG tablet Take 1 tablet by mouth twice daily 60 tablet 0   No facility-administered medications prior to visit.    Allergies  Allergen Reactions   Prednisone Other (See  Comments)    Kept her up and sent her into a manic mood    Sulfa Antibiotics Swelling    ROS Review of Systems  Constitutional:  Negative for chills and fever.  Eyes:  Positive for visual disturbance. Negative for photophobia, pain, discharge and redness.  Respiratory:  Negative for chest tightness and shortness of breath.   Neurological:  Negative for dizziness and headaches.      Objective:    Physical Exam HENT:     Head: Normocephalic.     Mouth/Throat:     Mouth: Mucous membranes are moist.  Cardiovascular:     Rate and Rhythm: Normal rate.     Heart sounds: Normal heart sounds.  Pulmonary:     Effort: Pulmonary effort is normal.     Breath sounds: Normal breath sounds.  Neurological:     Mental  Status: She is alert.     BP 106/72   Pulse 70   Ht 5\' 6"  (1.676 m)   Wt 159 lb 1.9 oz (72.2 kg)   SpO2 97%   BMI 25.68 kg/m  Wt Readings from Last 3 Encounters:  05/29/23 159 lb 1.9 oz (72.2 kg)  01/10/23 157 lb 1.9 oz (71.3 kg)  10/30/22 162 lb 3.2 oz (73.6 kg)    Lab Results  Component Value Date   TSH 1.600 01/13/2023   Lab Results  Component Value Date   WBC 5.9 01/13/2023   HGB 13.0 01/13/2023   HCT 37.7 01/13/2023   MCV 93 01/13/2023   PLT 194 01/13/2023   Lab Results  Component Value Date   NA 143 01/13/2023   K 4.1 01/13/2023   CO2 19 (L) 01/13/2023   GLUCOSE 102 (H) 01/13/2023   BUN 15 01/13/2023   CREATININE 1.16 (H) 01/13/2023   BILITOT 0.5 01/13/2023   ALKPHOS 143 (H) 01/13/2023   AST 22 01/13/2023   ALT 10 01/13/2023   PROT 6.0 01/13/2023   ALBUMIN 4.3 01/13/2023   CALCIUM 9.1 01/13/2023   EGFR 53 (L) 01/13/2023   Lab Results  Component Value Date   CHOL 203 (H) 01/13/2023   Lab Results  Component Value Date   HDL 110 01/13/2023   Lab Results  Component Value Date   LDLCALC 82 01/13/2023   Lab Results  Component Value Date   TRIG 61 01/13/2023   Lab Results  Component Value Date   CHOLHDL 1.8 01/13/2023   Lab Results  Component Value Date   HGBA1C 5.6 01/13/2023      Assessment & Plan:  Other specified hypothyroidism Assessment & Plan: Stable on Synthroid 50 mcg daily Will assess thyroid levels today Lab Results  Component Value Date   TSH 1.600 01/13/2023     Orders: -     TSH + free T4  Visual disturbance Assessment & Plan: Last eye exam was 2021 Referral placed to ophthalmology for further evaluation Please seek urgent care at the emergency department if you experience any of the following symptoms: sudden vision loss or a significant decrease in vision, severe eye pain, redness accompanied by pain, light sensitivity, the presence of spots or flashes in vision, double vision, or discharge from the eyes  accompanied by severe pain, redness, or visual changes    Visual disturbances -     Ambulatory referral to Ophthalmology  IFG (impaired fasting glucose) -     Hemoglobin A1c  Vitamin D deficiency -     VITAMIN D 25 Hydroxy (Vit-D Deficiency, Fractures)  Breast cancer  screening by mammogram -     3D Screening Mammogram, Left and Right  Other hyperlipidemia -     Lipid panel -     CMP14+EGFR -     CBC with Differential/Platelet  Note: This chart has been completed using Dragon Medical Dictation software, and while attempts have been made to ensure accuracy, certain words and phrases may not be transcribed as intended.    Follow-up: Return in about 4 months (around 09/29/2023).   Gilmore Laroche, FNP

## 2023-05-29 NOTE — Patient Instructions (Addendum)
I appreciate the opportunity to provide care to you today!    Follow up:  4 months  Labs: please stop by the lab during the week to get your blood drawn (CBC, CMP, TSH, Lipid profile, HgA1c, Vit D)   Please seek urgent care at the emergency department if you experience any of the following symptoms: sudden vision loss or a significant decrease in vision, severe eye pain, redness accompanied by pain, light sensitivity, the presence of spots or flashes in vision, double vision, or discharge from the eyes accompanied by severe pain, redness, or visual changes   Please schedule mammogram appointment  Referrals today- Ophthalmology     Please continue to a heart-healthy diet and increase your physical activities. Try to exercise for at least five days a week.    It was a pleasure to see you and I look forward to continuing to work together on your health and well-being. Please do not hesitate to call the office if you need care or have questions about your care.  In case of emergency, please visit the Emergency Department for urgent care, or contact our clinic at [phone] to schedule an appointment. We're here to help you!   Have a wonderful day and week. With Gratitude, Gilmore Laroche MSN, FNP-BC

## 2023-05-31 DIAGNOSIS — H539 Unspecified visual disturbance: Secondary | ICD-10-CM | POA: Insufficient documentation

## 2023-05-31 NOTE — Assessment & Plan Note (Signed)
Last eye exam was 2021 Referral placed to ophthalmology for further evaluation Please seek urgent care at the emergency department if you experience any of the following symptoms: sudden vision loss or a significant decrease in vision, severe eye pain, redness accompanied by pain, light sensitivity, the presence of spots or flashes in vision, double vision, or discharge from the eyes accompanied by severe pain, redness, or visual changes

## 2023-05-31 NOTE — Assessment & Plan Note (Signed)
Stable on Synthroid 50 mcg daily Will assess thyroid levels today Lab Results  Component Value Date   TSH 1.600 01/13/2023

## 2023-06-09 ENCOUNTER — Other Ambulatory Visit: Payer: Self-pay | Admitting: Family Medicine

## 2023-06-09 DIAGNOSIS — G4700 Insomnia, unspecified: Secondary | ICD-10-CM

## 2023-06-10 ENCOUNTER — Telehealth: Payer: Self-pay | Admitting: Professional Counselor

## 2023-06-10 LAB — TSH+FREE T4
Free T4: 1.32 ng/dL (ref 0.82–1.77)
TSH: 2.29 u[IU]/mL (ref 0.450–4.500)

## 2023-06-10 LAB — CMP14+EGFR
ALT: 11 [IU]/L (ref 0–32)
AST: 17 [IU]/L (ref 0–40)
Albumin: 4.4 g/dL (ref 3.9–4.9)
Alkaline Phosphatase: 165 [IU]/L — ABNORMAL HIGH (ref 44–121)
BUN/Creatinine Ratio: 13 (ref 12–28)
BUN: 12 mg/dL (ref 8–27)
Bilirubin Total: 0.4 mg/dL (ref 0.0–1.2)
CO2: 20 mmol/L (ref 20–29)
Calcium: 9 mg/dL (ref 8.7–10.3)
Chloride: 106 mmol/L (ref 96–106)
Creatinine, Ser: 0.94 mg/dL (ref 0.57–1.00)
Globulin, Total: 2 g/dL (ref 1.5–4.5)
Glucose: 111 mg/dL — ABNORMAL HIGH (ref 70–99)
Potassium: 4.3 mmol/L (ref 3.5–5.2)
Sodium: 142 mmol/L (ref 134–144)
Total Protein: 6.4 g/dL (ref 6.0–8.5)
eGFR: 68 mL/min/{1.73_m2} (ref >59)

## 2023-06-10 LAB — CBC WITH DIFFERENTIAL/PLATELET
Basophils Absolute: 0 10*3/uL (ref 0.0–0.2)
Basos: 1 %
EOS (ABSOLUTE): 0.1 10*3/uL (ref 0.0–0.4)
Eos: 3 %
Hematocrit: 40.4 % (ref 34.0–46.6)
Hemoglobin: 13.3 g/dL (ref 11.1–15.9)
Immature Grans (Abs): 0 10*3/uL (ref 0.0–0.1)
Immature Granulocytes: 0 %
Lymphocytes Absolute: 0.9 10*3/uL (ref 0.7–3.1)
Lymphs: 21 %
MCH: 31.2 pg (ref 26.6–33.0)
MCHC: 32.9 g/dL (ref 31.5–35.7)
MCV: 95 fL (ref 79–97)
Monocytes Absolute: 0.3 10*3/uL (ref 0.1–0.9)
Monocytes: 6 %
Neutrophils Absolute: 3.1 10*3/uL (ref 1.4–7.0)
Neutrophils: 69 %
Platelets: 225 10*3/uL (ref 150–450)
RBC: 4.26 x10E6/uL (ref 3.77–5.28)
RDW: 12.2 % (ref 11.7–15.4)
WBC: 4.5 10*3/uL (ref 3.4–10.8)

## 2023-06-10 LAB — LIPID PANEL
Chol/HDL Ratio: 1.7 {ratio} (ref 0.0–4.4)
Cholesterol, Total: 222 mg/dL — ABNORMAL HIGH (ref 100–199)
HDL: 129 mg/dL (ref >39)
LDL Chol Calc (NIH): 84 mg/dL (ref 0–99)
Triglycerides: 47 mg/dL (ref 0–149)
VLDL Cholesterol Cal: 9 mg/dL (ref 5–40)

## 2023-06-10 LAB — HEMOGLOBIN A1C
Est. average glucose Bld gHb Est-mCnc: 114 mg/dL
Hgb A1c MFr Bld: 5.6 % (ref 4.8–5.6)

## 2023-06-10 LAB — VITAMIN D 25 HYDROXY (VIT D DEFICIENCY, FRACTURES): Vit D, 25-Hydroxy: 24.4 ng/mL — ABNORMAL LOW (ref 30.0–100.0)

## 2023-06-10 NOTE — Telephone Encounter (Signed)
Patient was contacted by front desk to continue collaborative care. No answer and no attempt for follow up in 3 months we will sign off on this patient from collaborative care.

## 2023-06-17 ENCOUNTER — Other Ambulatory Visit: Payer: Self-pay | Admitting: Family Medicine

## 2023-06-17 DIAGNOSIS — E559 Vitamin D deficiency, unspecified: Secondary | ICD-10-CM

## 2023-06-17 MED ORDER — VITAMIN D (ERGOCALCIFEROL) 1.25 MG (50000 UNIT) PO CAPS
50000.0000 [IU] | ORAL_CAPSULE | ORAL | 1 refills | Status: DC
Start: 2023-06-17 — End: 2023-11-29

## 2023-06-17 NOTE — Progress Notes (Signed)
Please inform the patient that her vitamin D is slightly low, and a prescription for a weekly vitamin D supplement has been sent to her pharmacy. All other labs are stable.

## 2023-06-18 ENCOUNTER — Other Ambulatory Visit: Payer: Self-pay | Admitting: Family Medicine

## 2023-06-18 ENCOUNTER — Ambulatory Visit: Payer: Medicaid Other | Admitting: Family Medicine

## 2023-07-17 ENCOUNTER — Other Ambulatory Visit: Payer: Self-pay | Admitting: Family Medicine

## 2023-07-17 DIAGNOSIS — F3131 Bipolar disorder, current episode depressed, mild: Secondary | ICD-10-CM

## 2023-08-08 ENCOUNTER — Other Ambulatory Visit: Payer: Self-pay | Admitting: Family Medicine

## 2023-08-11 ENCOUNTER — Telehealth: Payer: Self-pay | Admitting: Orthopaedic Surgery

## 2023-08-11 NOTE — Telephone Encounter (Signed)
Tried to return the pt's call, vm is full

## 2023-08-14 ENCOUNTER — Other Ambulatory Visit: Payer: Self-pay | Admitting: Family Medicine

## 2023-08-14 DIAGNOSIS — F3131 Bipolar disorder, current episode depressed, mild: Secondary | ICD-10-CM

## 2023-08-26 ENCOUNTER — Other Ambulatory Visit: Payer: Self-pay | Admitting: Family Medicine

## 2023-08-26 DIAGNOSIS — K219 Gastro-esophageal reflux disease without esophagitis: Secondary | ICD-10-CM

## 2023-08-29 ENCOUNTER — Encounter: Payer: Self-pay | Admitting: Family Medicine

## 2023-08-29 ENCOUNTER — Ambulatory Visit: Payer: Self-pay | Admitting: Family Medicine

## 2023-08-29 ENCOUNTER — Ambulatory Visit (INDEPENDENT_AMBULATORY_CARE_PROVIDER_SITE_OTHER): Payer: Medicaid Other | Admitting: Family Medicine

## 2023-08-29 VITALS — BP 120/80 | HR 104 | Temp 98.6°F | Ht 66.0 in | Wt 161.1 lb

## 2023-08-29 DIAGNOSIS — R051 Acute cough: Secondary | ICD-10-CM | POA: Diagnosis not present

## 2023-08-29 DIAGNOSIS — Z1231 Encounter for screening mammogram for malignant neoplasm of breast: Secondary | ICD-10-CM

## 2023-08-29 DIAGNOSIS — M792 Neuralgia and neuritis, unspecified: Secondary | ICD-10-CM | POA: Diagnosis not present

## 2023-08-29 DIAGNOSIS — G8929 Other chronic pain: Secondary | ICD-10-CM | POA: Diagnosis not present

## 2023-08-29 DIAGNOSIS — M533 Sacrococcygeal disorders, not elsewhere classified: Secondary | ICD-10-CM | POA: Diagnosis not present

## 2023-08-29 MED ORDER — BENZONATATE 100 MG PO CAPS: 100.0000 mg | ORAL_CAPSULE | Freq: Two times a day (BID) | ORAL | 0 refills | Status: DC | PRN

## 2023-08-29 MED ORDER — MELOXICAM 15 MG PO TABS: 15.0000 mg | ORAL_TABLET | Freq: Every day | ORAL | 0 refills | Status: DC

## 2023-08-29 MED ORDER — METHYLPREDNISOLONE ACETATE 80 MG/ML IJ SUSP
40.0000 mg | Freq: Once | INTRAMUSCULAR | Status: AC
  Administered 2023-08-29: 40 mg via INTRAMUSCULAR

## 2023-08-29 NOTE — Progress Notes (Signed)
   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 Pain has persisted and in the past 1 month is an 8 to 10 at night experienced as left inner groin pain to mid thighup to 8 to 10 disturbs sleep Runny niose , burning in throat and cough started yesterday, this am temp of 101  which reportedly has decreased on ios own  ROS  Denies chest pains, palpitations and leg swelling Denies abdominal pain, nausea, vomiting,diarrhea or constipation.   Denies dysuria, frequency, hesitancy or incontinence.  Denies headaches, seizures, numbness, or tingling. Denies depression, anxiety or insomnia. Denies skin break down or rash.   PE  BP 120/80   Pulse (!) 104   Temp 98.6 F (37 C) (Oral)   Ht 5' 6 (1.676 m)   Wt 161 lb 1.3 oz (73.1 kg)   SpO2 97%   BMI 26.00 kg/m   Patient alert and oriented and in no cardiopulmonary distress.  HEENT: No facial asymmetry, EOMI,     Neck supple .no sinus tenderness  Chest: Clear to auscultation bilaterally.  CVS: S1, S2 no murmurs, no S3.  ABD: Soft non tender.   Ext: No edema  MS: decreased  ROM lumbar spine, tender over left SI joint, adequate in  shoulders,  and knees.Decreased in left hip  Skin: Intact, no ulcerations or rash noted.  Psych: Good eye contact, normal affect. Memory intact not anxious or depressed appearing.  CNS: CN 2-12 intact, power,  normal throughout.no focal deficits noted.   Assessment & Plan  Chronic left SI joint pain Depo medriol  40 mg iM followed by meloxicam  and Ortho eval  Acute cough Tessalon  perle and push fluids  Neuropathic pain of thigh, left [Pain between 8 to 10 at night disturbing sleep worsening in past 1 month, refer Ortho

## 2023-08-29 NOTE — Patient Instructions (Addendum)
 Follow-up with PCP as before, call if you need to be seen sooner.  PLEASE schedule mammogram at checkout.  You are referred to orthopedics for ongoing management of left hip pain.  Depo-Medrol  40 mg IM in the office today and start today meloxicam  1 tablet once daily 2 weeks total is prescribed.  Tessalon  Perles are prescribed to help with cough and chest congestion,  you do not have a fever in the office at today's visit.  Continue to push water  and also drink hot teas.  Thanks for choosing [facility], we consider it a privelige to serve you.

## 2023-08-29 NOTE — Assessment & Plan Note (Addendum)
 Tessalon perle and push fluids

## 2023-08-29 NOTE — Assessment & Plan Note (Addendum)
 Depo medriol  40 mg iM followed by meloxicam and Ortho eval

## 2023-08-29 NOTE — Telephone Encounter (Signed)
 Copied from CRM [id number]. Topic: Clinical - Red Word Triage >> Aug 29, 2023  8:03 AM Nestora J wrote: Red Word that prompted transfer to Nurse Triage: Fever 104  Chief Complaint: fever 104 Symptoms: fever, cough, scratchy throat Frequency: yesterday Pertinent Negatives: Patient denies SOB Disposition: [] ED /[] Urgent Care (no appt availability in office) / [x] Appointment(In office/virtual)/ []  [facility]/ [] Home Care/ [] Refused Recommended Disposition /[]  Mobile Bus/ []  Follow-up with PCP Additional Notes: pt has appt for hip today and nurse advised to keep appt to get evaluated for hip and fever  Reason for Disposition  Fever present > 3 days (72 hours)  Answer Assessment - Initial Assessment Questions 1. TEMPERATURE: What is the most recent temperature?  How was it measured?      100.4 2. ONSET: When did the fever start?      Yesterday/ last night 3. CHILLS: Do you have chills? If yes: How bad are they?  (e.g., none, mild, moderate, severe)   - NONE: no chills   - MILD: feeling cold   - MODERATE: feeling very cold, some shivering (feels better under a thick blanket)   - SEVERE: feeling extremely cold with shaking chills (general body shaking, rigors; even under a thick blanket)      Cough - productive of unknown color,  4. OTHER SYMPTOMS: Do you have any other symptoms besides the fever?  (e.g., abdomen pain, cough, diarrhea, earache, headache, sore throat, urination pain)     Scratchy throat 5. CAUSE: If there are no symptoms, ask: What do you think is causing the fever?      COVID test taken at home was negative 6. CONTACTS: Does anyone else in the family have an infection?     no 7. TREATMENT: What have you done so far to treat this fever? (e.g., medications)     No medications at present time 8. IMMUNOCOMPROMISE: Do you have of the following: diabetes, HIV positive, splenectomy, cancer chemotherapy, chronic steroid treatment, transplant  patient, etc.     no 9. PREGNANCY: Is there any chance you are pregnant? When was your last menstrual period?     N/a 10. TRAVEL: Have you traveled out of the country in the last month? (e.g., travel history, exposures)       N/a  Protocols used: [facility]

## 2023-08-31 ENCOUNTER — Other Ambulatory Visit: Payer: Self-pay | Admitting: Family Medicine

## 2023-08-31 DIAGNOSIS — Z1231 Encounter for screening mammogram for malignant neoplasm of breast: Secondary | ICD-10-CM | POA: Insufficient documentation

## 2023-08-31 DIAGNOSIS — M792 Neuralgia and neuritis, unspecified: Secondary | ICD-10-CM | POA: Insufficient documentation

## 2023-08-31 DIAGNOSIS — G4700 Insomnia, unspecified: Secondary | ICD-10-CM

## 2023-08-31 NOTE — Assessment & Plan Note (Signed)
[  Pain between 8 to 10 at night disturbing sleep worsening in past 1 month, refer Ortho

## 2023-09-03 ENCOUNTER — Encounter: Payer: Self-pay | Admitting: Orthopaedic Surgery

## 2023-09-03 ENCOUNTER — Ambulatory Visit (INDEPENDENT_AMBULATORY_CARE_PROVIDER_SITE_OTHER): Payer: Medicaid Other | Admitting: Orthopaedic Surgery

## 2023-09-03 ENCOUNTER — Other Ambulatory Visit (INDEPENDENT_AMBULATORY_CARE_PROVIDER_SITE_OTHER): Payer: Self-pay

## 2023-09-03 VITALS — BP 131/80 | HR 73

## 2023-09-03 DIAGNOSIS — M5442 Lumbago with sciatica, left side: Secondary | ICD-10-CM

## 2023-09-03 DIAGNOSIS — G8929 Other chronic pain: Secondary | ICD-10-CM | POA: Diagnosis not present

## 2023-09-03 DIAGNOSIS — M25552 Pain in left hip: Secondary | ICD-10-CM

## 2023-09-03 MED ORDER — HYDROCODONE-ACETAMINOPHEN 5-325 MG PO TABS: ORAL_TABLET | ORAL | 0 refills | Status: DC

## 2023-09-03 NOTE — Progress Notes (Signed)
 My back and hip hurt.  She has had left hip pain and left lower back pain that goes to the knee on the left for several months.  It began last summer.  She has not improved.  She saw Dr. Antonetta 08-29-2023 and was given shot of DepoMedrol 40 and begun on Mobic .  She is feeling a little better but still hurts.  Spine/Pelvis examination:  Inspection:  Overall, sacoiliac joint benign and hips nontender; without crepitus or defects.   Thoracic spine inspection: Alignment normal without kyphosis present   Lumbar spine inspection:  Alignment  with normal lumbar lordosis, without scoliosis apparent.   Thoracic spine palpation:  without tenderness of spinal processes   Lumbar spine palpation: without tenderness of lumbar area; without tightness of lumbar muscles    Range of Motion:   Lumbar flexion, forward flexion is normal without pain or tenderness    Lumbar extension is full without pain or tenderness   Left lateral bend is normal without pain or tenderness   Right lateral bend is normal without pain or tenderness   Straight leg raising is normal  Strength & tone: normal   Stability overall normal stability  ROM of the left hip is full.  NV intact.  X-rays were done of the left hip and lower back, reported separately.  Encounter Diagnoses  Name Primary?   Pain in left hip Yes   Chronic left-sided low back pain with left-sided sciatica    She is to continue the Mobic .  I will give pain medicine.  She will most likely need a MRI.  She needs to have PT and a six week wait before Medicaid will allow MRI.  I will set up PT.  I have reviewed the [place]  Controlled Substance Reporting System web site prior to prescribing narcotic medicine for this patient.  Return in two weeks.  Call if any problem.  Precautions discussed.  Electronically Signed Lemond Stable, MD 1/8/202511:57 AM

## 2023-09-03 NOTE — Patient Instructions (Signed)
 Call [phone] to schedule your physical therapy

## 2023-09-08 ENCOUNTER — Other Ambulatory Visit: Payer: Self-pay | Admitting: Family Medicine

## 2023-09-08 DIAGNOSIS — F3131 Bipolar disorder, current episode depressed, mild: Secondary | ICD-10-CM

## 2023-09-11 ENCOUNTER — Ambulatory Visit (HOSPITAL_COMMUNITY)
Admission: RE | Admit: 2023-09-11 | Discharge: 2023-09-11 | Disposition: A | Discharge status: Home / Self Care | Payer: Medicaid Other | Source: Ambulatory Visit | Attending: Family Medicine | Admitting: Family Medicine

## 2023-09-11 ENCOUNTER — Ambulatory Visit (HOSPITAL_COMMUNITY): Payer: Medicaid Other

## 2023-09-11 DIAGNOSIS — Z1231 Encounter for screening mammogram for malignant neoplasm of breast: Secondary | ICD-10-CM | POA: Diagnosis present

## 2023-09-15 ENCOUNTER — Other Ambulatory Visit: Payer: Self-pay | Admitting: Family Medicine

## 2023-09-16 ENCOUNTER — Ambulatory Visit (HOSPITAL_COMMUNITY): Payer: Medicaid Other | Attending: Orthopaedic Surgery

## 2023-09-16 ENCOUNTER — Other Ambulatory Visit: Payer: Self-pay

## 2023-09-16 DIAGNOSIS — M25552 Pain in left hip: Secondary | ICD-10-CM | POA: Insufficient documentation

## 2023-09-16 DIAGNOSIS — M5442 Lumbago with sciatica, left side: Secondary | ICD-10-CM | POA: Diagnosis not present

## 2023-09-16 DIAGNOSIS — G8929 Other chronic pain: Secondary | ICD-10-CM | POA: Diagnosis not present

## 2023-09-16 DIAGNOSIS — M5459 Other low back pain: Secondary | ICD-10-CM | POA: Insufficient documentation

## 2023-09-16 DIAGNOSIS — R262 Difficulty in walking, not elsewhere classified: Secondary | ICD-10-CM | POA: Diagnosis present

## 2023-09-16 NOTE — Therapy (Addendum)
OUTPATIENT PHYSICAL THERAPY THORACOLUMBAR EVALUATION   Patient Name: Anita Berry MRN: [id number] DOB:1960/11/02, 63 y.o., female Today's Date: 09/16/2023  END OF SESSION:  PT End of Session - 09/16/23 1209     Visit Number 1    Number of Visits 8    Date for PT Re-Evaluation 10/14/23    Authorization Type [place] Medicaid [employer] (requested 8 visits)    PT Start Time 1100    PT Stop Time 1145    PT Time Calculation (min) 45 min    Activity Tolerance Patient tolerated treatment well    Behavior During Therapy WFL for tasks assessed/performed            Past Medical History:  Diagnosis Date   Bipolar 1 disorder (HCC)    Bipolar disorder, unspecified (HCC)    Dementia (HCC)    Thyroid disease    Past Surgical History:  Procedure Laterality Date   CHOLECYSTECTOMY     SHOULDER SURGERY Right    TONSILLECTOMY     Patient Active Problem List   Diagnosis Date Noted   Neuropathic pain of thigh, left 08/31/2023   Encounter for screening mammogram for malignant neoplasm of breast 08/31/2023   Chronic left SI joint pain 08/29/2023   Acute cough 08/29/2023   Visual disturbance 05/31/2023   Social anxiety disorder 01/10/2023   Right hand pain 01/10/2023   Chest pain of uncertain etiology 10/31/2022   DOE (dyspnea on exertion) 10/31/2022   Sinus bradycardia 10/31/2022   Irregular heartbeat 10/11/2022   Rash of face 07/11/2022   Left hand pain 07/11/2022   Screening for cervical cancer 01/08/2022   Cellulitis of face 01/08/2022   Osteopenia 01/08/2022   Elevated alkaline phosphatase level 01/08/2022   Gastroesophageal reflux disease 08/10/2021   Fracture, cervical vertebra (HCC) 03/28/2021   Encounter for general adult medical examination with abnormal findings 03/05/2021   Insomnia 03/05/2021   Vitamin D deficiency 03/05/2021   Dementia (HCC) 03/05/2021   Bipolar disorder (HCC) 02/08/2019   Hypothyroidism 02/08/2019   Osteoporosis 02/08/2019    PCP: Gilmore Laroche,  FNP  REFERRING PROVIDER: Darreld Mclean, MD  REFERRING DIAG: [phone] (ICD-10-CM) - Chronic left-sided low back pain with left-sided sciatica  Rationale for Evaluation and Treatment: Rehabilitation  THERAPY DIAG:  Other low back pain  Pain in left hip  Difficulty in walking, not elsewhere classified  ONSET DATE: Summer 2024  SUBJECTIVE:                                                                                                                                                                                           SUBJECTIVE STATEMENT: EVAL:  Arrives to the clinic with low back pain (see below). Denies any weakness and numbness on the legs. Condition started summer 2024 when she picked up her sister who fell. Did not hear any popping or clicking. Since then, pain has just gotten worse. Patient had an X-ray and thought that her issue is coming her low back. Patient was given injections on her buttocks which helped but did not last long. Patient was then referred to outpatient PT evaluation and management.  PERTINENT HISTORY:  Osteoporosis, bipolar disorder  PAIN:  Are you having pain? Yes: NPRS scale: 3 Pain location:  on the low back and L groin to the inner side of the L knee Pain description: aching pain Aggravating factors: evening, sitting for around 20 secs Relieving factors: morning, standing  PRECAUTIONS: None  RED FLAGS: None   WEIGHT BEARING RESTRICTIONS: No  FALLS:  Has patient fallen in last 6 months? Yes. 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  DIAGNOSTIC FINDINGS:  09/03/23 X-rays were done of the lumbar spine, five views.   Normal lumbar lordosis is present.  Disc spaces are well maintained.  No fracture is present.  Mild degenerative changes anteriorly are present.  Bone quality is good.   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  PATIENT SURVEYS:  Modified Oswestry 22/50 = 44%   COGNITION: Overall cognitive status: Within functional limits for tasks assessed     SENSATION: WFL  MUSCLE LENGTH: Hamstrings: moderate restriction on B Thomas test: moderate on L, mild on R Piriformis: difficult to assess due to pain  POSTURE: rounded shoulders, forward head, and decreased lumbar lordosis   LUMBAR ROM:   AROM eval  Flexion 100%  Extension 100%*  Right lateral flexion   Left lateral flexion   Right rotation 100%  Left rotation 100%   (Blank rows = not tested) *Extension aggravates low back pain  LOWER EXTREMITY ROM:     Active  Right eval Left eval  Hip flexion WFL 90  Hip extension [facility]  Hip abduction WFL 30  Hip adduction    Hip internal rotation WFL 30  Hip external rotation [facility] 30  Knee flexion [facility] WFL  Knee extension [facility]  Ankle dorsiflexion [facility] WFL  Ankle plantarflexion [facility] WFL  Ankle inversion    Ankle eversion     (Blank rows = not tested)  LOWER EXTREMITY MMT:    MMT Right eval Left eval  Hip flexion 4 3+  Hip extension 4 4-  Hip abduction 4 3+  Hip adduction    Hip internal rotation    Hip external rotation    Knee flexion 5 5  Knee extension 5 3+  Ankle dorsiflexion 5 5  Ankle plantarflexion 5 5  Ankle inversion    Ankle eversion     (Blank rows = not tested)  LUMBAR SPECIAL TESTS:  R SLR aggravated L hip pain, (+) L FABER, (+) L FADIR, (-) prone knee bending test  FUNCTIONAL TESTS:  5 times sit to stand: 12.75 sec (pain hurts  when sitting down) 2 minute walk test: 458 ft  GAIT: Distance walked: 458 ft Assistive device utilized: None Level of assistance: Complete Independence Comments: done during , with slight to mild antalgic gait  TREATMENT DATE:  09/16/23 Evaluation and patient education done (40 minutes) Gait training with SPC using 2-point pattern for around 100 ft                                                                                                                               PATIENT EDUCATION:  Education details: Educated on the pathoanatomy of low back and hip pain. Educated on the goals and course of rehab.  Person educated: Patient Education method: Explanation Education comprehension: verbalized understanding  HOME EXERCISE PROGRAM: None provided to date.  ASSESSMENT:  CLINICAL IMPRESSION: EVAL: Patient is a 63 y.o. female who was seen today for physical therapy evaluation and treatment for low back pain. Patient's condition is further defined by difficulty with prolonged sitting due to pain, weakness, and decreased soft tissue extensibility. Skilled PT is required to address the impairments and functional limitations listed below. Based on the objective findings above, patient maybe presenting signs and symptoms of hip labral affectation. Patient may need to be seen by referring provider for further evaluation and management. Patient tolerated gait training with SPC with significant relief from pain. Patient gave excellent understanding when instructed on proper cane adjustment and measurement.    OBJECTIVE IMPAIRMENTS: Abnormal gait, decreased activity tolerance, difficulty walking, decreased ROM, decreased strength, impaired flexibility, and pain.   ACTIVITY LIMITATIONS: carrying, lifting, bending, and sitting  PARTICIPATION LIMITATIONS: meal prep, cleaning, laundry, driving, shopping, community activity, and yard work  PERSONAL FACTORS: Time since onset of  injury/illness/exacerbation and osteoporosis  are also affecting patient's functional outcome.   REHAB POTENTIAL: Good  CLINICAL DECISION MAKING: Stable/uncomplicated  EVALUATION COMPLEXITY: Low   GOALS: Goals reviewed with patient? Yes  SHORT TERM GOALS: Target date: 09/30/23  Pt will demonstrate indep in HEP to facilitate carry-over of skilled services and improve functional outcomes Goal status: INITIAL  LONG TERM GOALS: Target date: 10/14/23  Pt will demonstrate a decrease in modified ODI score by 13-14% to demonstrate significant improvement in ADLs. Baseline: 44% Goal status: INITIAL  2.  Pt will increase by at least 40 ft in order to demonstrate clinically significant improvement in community ambulation  Baseline: 458 ft Goal status: INITIAL  3.  Pt will demonstrate increase in LE strength to 4/5 to facilitate ease and safety in ambulation Baseline: 3+/5 Goal status: INITIAL  4.  Pt will be able to sit > 30 min with little to no pain (0-2/10) to facilitate ease in ADLs Baseline: 3/10 Goal status: INITIAL   PLAN:  PT FREQUENCY: 2x/week  PT DURATION: 4 weeks  PLANNED INTERVENTIONS: 97164- PT Re-evaluation, 97110-Therapeutic exercises, 97530- Therapeutic activity, [id number]- Neuromuscular re-education, 97535- Self Care, 16109- Manual therapy, 60454-  Gait training, and Patient/Family education.  PLAN FOR NEXT SESSION: Provide HEP. Begin core and hip strengthening as well as flexibility activities.   Tish Frederickson. Aldea Avis, PT, DPT, OCS Board-Certified Clinical Specialist in Orthopedic PT PT Compact Privilege # ([place]): [phone] T 09/16/2023, 5:09 PM    Managed Medicaid Authorization Request  Visit Dx Codes: M54.59, M25.552, R26.2  Functional Tool Score: 2 minute walk test = 458 ft, 5 times sit-to-stand = 12.75 sec, Modified Oswestry = 44%  For all possible CPT codes, reference the Planned Interventions line above.     Check all conditions that are expected to  impact treatment: {Conditions expected to impact treatment:None of these apply   If treatment provided at initial evaluation, no treatment charged due to lack of authorization.

## 2023-09-16 NOTE — Addendum Note (Signed)
Addended by: Trudee Grip on: 09/16/2023 05:17 PM   Modules accepted: Orders

## 2023-09-17 ENCOUNTER — Ambulatory Visit: Payer: Medicaid Other | Admitting: Orthopaedic Surgery

## 2023-09-17 ENCOUNTER — Encounter: Payer: Self-pay | Admitting: Orthopaedic Surgery

## 2023-09-17 VITALS — BP 153/89 | HR 76

## 2023-09-17 DIAGNOSIS — M25552 Pain in left hip: Secondary | ICD-10-CM | POA: Diagnosis not present

## 2023-09-17 DIAGNOSIS — M5442 Lumbago with sciatica, left side: Secondary | ICD-10-CM | POA: Diagnosis not present

## 2023-09-17 DIAGNOSIS — G8929 Other chronic pain: Secondary | ICD-10-CM

## 2023-09-17 MED ORDER — MELOXICAM 15 MG PO TABS: 15.0000 mg | ORAL_TABLET | Freq: Every day | ORAL | 5 refills | Status: DC

## 2023-09-17 NOTE — Progress Notes (Signed)
My hip is hurting more.  She has been seen in PT and the therapist is concerned about a labral tear of the left hip.  I will get MRI of the left hip.  Her back is better with less pain.  NV intact. ROM of the left hip is good.  Encounter Diagnoses  Name Primary?   Chronic left-sided low back pain with left-sided sciatica Yes   Left hip pain    To get MRI of the left hip.  Continue PT.  I will refill Mobic.  Return in two weeks.  Electronically Signed Darreld Mclean, MD 1/22/20258:48 AM

## 2023-09-17 NOTE — Patient Instructions (Signed)
While we are working on your approval for MRI please go ahead and call to schedule your appointment with [facility] within at least one (1) week.   Central Scheduling [phone]   They will inject your hip before the MRI

## 2023-09-23 ENCOUNTER — Encounter (HOSPITAL_COMMUNITY): Payer: Self-pay

## 2023-09-23 ENCOUNTER — Ambulatory Visit (HOSPITAL_COMMUNITY): Payer: Medicaid Other | Admitting: Physical Therapy

## 2023-09-25 ENCOUNTER — Ambulatory Visit (INDEPENDENT_AMBULATORY_CARE_PROVIDER_SITE_OTHER): Payer: Medicaid Other | Admitting: Physical Therapy

## 2023-09-25 DIAGNOSIS — M5459 Other low back pain: Secondary | ICD-10-CM | POA: Diagnosis not present

## 2023-09-25 DIAGNOSIS — M25552 Pain in left hip: Secondary | ICD-10-CM

## 2023-09-25 DIAGNOSIS — M5442 Lumbago with sciatica, left side: Secondary | ICD-10-CM | POA: Diagnosis not present

## 2023-09-25 DIAGNOSIS — R262 Difficulty in walking, not elsewhere classified: Secondary | ICD-10-CM

## 2023-09-25 NOTE — Therapy (Signed)
OUTPATIENT PHYSICAL THERAPY TREATMENT   Patient Name: Anita Berry MRN: [id number] DOB:12/14/1960, 63 y.o., female Today's Date: 09/25/2023  END OF SESSION:  PT End of Session - 09/25/23 0854     Visit Number 2    Number of Visits 8    Date for PT Re-Evaluation 10/14/23    Authorization Type [place] Medicaid [employer] (requested 8 visits)    Authorization Time Period 12 visits approved 09/16/23-11/15/23    Authorization - Visit Number 1    Authorization - Number of Visits 12    Progress Note Due on Visit 10    PT Start Time 0850    PT Stop Time 0930    PT Time Calculation (min) 40 min    Activity Tolerance Patient tolerated treatment well    Behavior During Therapy WFL for tasks assessed/performed            Past Medical History:  Diagnosis Date   Bipolar 1 disorder (HCC)    Bipolar disorder, unspecified (HCC)    Dementia (HCC)    Thyroid disease    Past Surgical History:  Procedure Laterality Date   CHOLECYSTECTOMY     SHOULDER SURGERY Right    TONSILLECTOMY     Patient Active Problem List   Diagnosis Date Noted   Neuropathic pain of thigh, left 08/31/2023   Encounter for screening mammogram for malignant neoplasm of breast 08/31/2023   Chronic left SI joint pain 08/29/2023   Acute cough 08/29/2023   Visual disturbance 05/31/2023   Social anxiety disorder 01/10/2023   Right hand pain 01/10/2023   Chest pain of uncertain etiology 10/31/2022   DOE (dyspnea on exertion) 10/31/2022   Sinus bradycardia 10/31/2022   Irregular heartbeat 10/11/2022   Rash of face 07/11/2022   Left hand pain 07/11/2022   Screening for cervical cancer 01/08/2022   Cellulitis of face 01/08/2022   Osteopenia 01/08/2022   Elevated alkaline phosphatase level 01/08/2022   Gastroesophageal reflux disease 08/10/2021   Fracture, cervical vertebra (HCC) 03/28/2021   Encounter for general adult medical examination with abnormal findings 03/05/2021   Insomnia 03/05/2021   Vitamin D deficiency  03/05/2021   Dementia (HCC) 03/05/2021   Bipolar disorder (HCC) 02/08/2019   Hypothyroidism 02/08/2019   Osteoporosis 02/08/2019    PCP: Gilmore Laroche, FNP  REFERRING PROVIDER: Darreld Mclean, MD  REFERRING DIAG: [phone] (ICD-10-CM) - Chronic left-sided low back pain with left-sided sciatica  Rationale for Evaluation and Treatment: Rehabilitation  THERAPY DIAG:  Other low back pain  Pain in left hip  Difficulty in walking, not elsewhere classified  ONSET DATE: Summer 2024  SUBJECTIVE:  SUBJECTIVE STATEMENT: Pt states her MRI is scheduled for February 19.   No pain currently but usually doesn't in the morning but increases as the day goes.  States she can take a step the wrong way and her Rt LE will give way, intense pain having to lean forward.  EVAL: Arrives to the clinic with low back pain (see below). Denies any weakness and numbness on the legs. Condition started summer 2024 when she picked up her sister who fell. Did not hear any popping or clicking. Since then, pain has just gotten worse. Patient had an X-ray and thought that her issue is coming her low back. Patient was given injections on her buttocks which helped but did not last long. Patient was then referred to outpatient PT evaluation and management.  PERTINENT HISTORY:  Osteoporosis, bipolar disorder  PAIN:  Are you having pain? Yes: NPRS scale: 3 Pain location:  on the low back and L groin to the inner side of the L knee Pain description: aching pain Aggravating factors: evening, sitting for around 20 secs Relieving factors: morning, standing  PRECAUTIONS: None  RED FLAGS: None   WEIGHT BEARING RESTRICTIONS: No  FALLS:  Has patient fallen in last 6 months? Yes. 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  DIAGNOSTIC FINDINGS:  09/03/23 X-rays were done of the lumbar spine, five views.   Normal lumbar lordosis is present.  Disc spaces are well maintained.  No fracture is present.  Mild degenerative changes anteriorly are present.  Bone quality is good.   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  PATIENT SURVEYS:  Modified Oswestry 22/50 = 44%   COGNITION: Overall cognitive status: Within functional limits for tasks assessed     SENSATION: WFL  MUSCLE LENGTH: Hamstrings: moderate restriction on B Thomas test: moderate on L, mild on R Piriformis: difficult to assess due to pain  POSTURE: rounded shoulders, forward head, and decreased lumbar lordosis   LUMBAR ROM:   AROM eval  Flexion 100%  Extension 100%*  Right lateral flexion   Left lateral flexion   Right rotation 100%  Left rotation 100%   (Blank rows = not tested) *Extension aggravates low back pain  LOWER EXTREMITY ROM:     Active  Right eval Left eval  Hip flexion WFL 90  Hip extension [facility]  Hip abduction WFL 30  Hip adduction    Hip internal rotation WFL 30  Hip external rotation [facility] 30  Knee flexion [facility]  Knee extension [facility]  Ankle dorsiflexion [facility] WFL  Ankle plantarflexion [facility] WFL  Ankle inversion    Ankle eversion     (Blank rows = not tested)  LOWER EXTREMITY MMT:    MMT Right eval Left eval  Hip flexion 4 3+  Hip extension 4 4-  Hip abduction 4 3+   Hip adduction    Hip internal rotation    Hip external rotation    Knee flexion 5 5  Knee extension  5 3+  Ankle dorsiflexion 5 5  Ankle plantarflexion 5 5  Ankle inversion    Ankle eversion     (Blank rows = not tested)  LUMBAR SPECIAL TESTS:  R SLR aggravated L hip pain, (+) L FABER, (+) L FADIR, (-) prone knee bending test  FUNCTIONAL TESTS:  5 times sit to stand: 12.75 sec (pain hurts when sitting down) 2 minute walk test: 458 ft  GAIT: Distance walked: 458 ft Assistive device utilized: None Level of assistance: Complete Independence Comments: done during , with slight to mild antalgic gait  TREATMENT DATE:  09/25/23 Goal review Seated LAQ 10X5" each Supine: bridge 10X  SLR 10X each  Clam hip abd with GTB 10X 5" each  Hamstring stretch with strap 2X30"    09/16/23 Evaluation and patient education done (40 minutes) Gait training with SPC using 2-point pattern for around 100 ft                                                                                                                               PATIENT EDUCATION:  Education details: Educated on the pathoanatomy of low back and hip pain. Educated on the goals and course of rehab.  Person educated: Patient Education method: Explanation Education comprehension: verbalized understanding  HOME EXERCISE PROGRAM: None provided at evaluation  Access Code: [email or web address] URL: [email or web address]  Date: 09/25/2023 Prepared by: Emeline Gins Exercises - Seated Long Arc Quad  - 2 x daily - 7 x weekly - 10 reps - 5" hold - Beginner Bridge  - 2 x daily - 7 x weekly - 10 reps - 5 sec hold - Straight Leg Raise  - 2 x daily - 7 x weekly - 10 reps - Hooklying Clamshell with Resistance  - 2 x daily - 7 x weekly - 10 reps - 5 sec hold - Hooklying Hamstring Stretch with Strap  - 2 x daily - 7 x weekly - 3 reps - 30 sec hold  ASSESSMENT:  CLINICAL IMPRESSION: Reviewed goals and POC moving forward.  Educated  on hip anatomy and possible causes of certain symptoms she is having.  Began with strengthening exercises and provided written HEP this session.  Pt did not have any pain completing therex, minimal cues needed for form and holds.  Pt will continue to benefit form skilled therapy to progress towards goals.     EVAL: Patient is a 63 y.o. female who was seen today for physical therapy evaluation and treatment for low back pain. Patient's condition is further defined by difficulty with prolonged sitting due to pain, weakness, and decreased soft tissue extensibility. Skilled PT is required to address the impairments and functional limitations listed below. Based on the objective findings above, patient maybe presenting signs and symptoms of hip labral affectation. Patient may need to be seen by referring provider for further evaluation and management. Patient tolerated gait training with SPC with significant relief from pain. Patient gave excellent  understanding when instructed on proper cane adjustment and measurement.    OBJECTIVE IMPAIRMENTS: Abnormal gait, decreased activity tolerance, difficulty walking, decreased ROM, decreased strength, impaired flexibility, and pain.   ACTIVITY LIMITATIONS: carrying, lifting, bending, and sitting  PARTICIPATION LIMITATIONS: meal prep, cleaning, laundry, driving, shopping, community activity, and yard work  PERSONAL FACTORS: Time since onset of injury/illness/exacerbation and osteoporosis  are also affecting patient's functional outcome.   REHAB POTENTIAL: Good  CLINICAL DECISION MAKING: Stable/uncomplicated  EVALUATION COMPLEXITY: Low   GOALS: Goals reviewed with patient? Yes  SHORT TERM GOALS: Target date: 09/30/23  Pt will demonstrate indep in HEP to facilitate carry-over of skilled services and improve functional outcomes Goal status: IN PROGRESS  LONG TERM GOALS: Target date: 10/14/23  Pt will demonstrate a decrease in modified ODI score by 13-14%  to demonstrate significant improvement in ADLs. Baseline: 44% Goal status: IN PROGRESS  2.  Pt will increase by at least 40 ft in order to demonstrate clinically significant improvement in community ambulation  Baseline: 458 ft Goal status: IN PROGRESS  3.  Pt will demonstrate increase in LE strength to 4/5 to facilitate ease and safety in ambulation Baseline: 3+/5 Goal status: IN PROGRESS  4.  Pt will be able to sit > 30 min with little to no pain (0-2/10) to facilitate ease in ADLs Baseline: 3/10 Goal status: IN PROGRESS   PLAN:  PT FREQUENCY: 2x/week  PT DURATION: 4 weeks  PLANNED INTERVENTIONS: 97164- PT Re-evaluation, 97110-Therapeutic exercises, 97530- Therapeutic activity, [id number]- Neuromuscular re-education, 97535- Self Care, 16109- Manual therapy, [phone]- Gait training, and Patient/Family education.  PLAN FOR NEXT SESSION: Progress core and hip strengthening as well as flexibility activities.   Lurena Nida, PTA/CLT [facility] Ph: [phone] 09/25/2023, 8:58 AM

## 2023-09-26 ENCOUNTER — Telehealth: Payer: Self-pay | Admitting: Orthopaedic Surgery

## 2023-09-26 MED ORDER — HYDROCODONE-ACETAMINOPHEN 5-325 MG PO TABS: ORAL_TABLET | ORAL | 0 refills | Status: DC

## 2023-09-26 NOTE — Telephone Encounter (Signed)
DR. Hilda Lias  Patient called requesting her pain medicine called in   HYDROcodone-acetaminophen (NORCO/VICODIN) 5-325 MG tablet    Pharmacy: [facility]

## 2023-09-30 ENCOUNTER — Ambulatory Visit (HOSPITAL_COMMUNITY): Payer: Medicaid Other | Attending: Orthopaedic Surgery

## 2023-09-30 ENCOUNTER — Ambulatory Visit: Payer: Medicaid Other | Admitting: Family Medicine

## 2023-09-30 DIAGNOSIS — R262 Difficulty in walking, not elsewhere classified: Secondary | ICD-10-CM | POA: Insufficient documentation

## 2023-09-30 DIAGNOSIS — M25552 Pain in left hip: Secondary | ICD-10-CM | POA: Diagnosis present

## 2023-09-30 DIAGNOSIS — M5459 Other low back pain: Secondary | ICD-10-CM | POA: Diagnosis present

## 2023-09-30 NOTE — Therapy (Addendum)
 OUTPATIENT PHYSICAL THERAPY TREATMENT   Patient Name: Anita Berry MRN: [id number] DOB:10/10/1960, 63 y.o., female Today's Date: 09/30/2023  END OF SESSION:  PT End of Session - 09/30/23 0859     Visit Number 3    Number of Visits 8    Date for PT Re-Evaluation 10/14/23    Authorization Type [place] [employer]    Authorization Time Period 12 visits approved 09/16/23-11/15/23    PT Start Time 0850    PT Stop Time 0930    PT Time Calculation (min) 40 min    Activity Tolerance Patient tolerated treatment well    Behavior During Therapy WFL for tasks assessed/performed              Past Medical History:  Diagnosis Date   Bipolar 1 disorder (HCC)    Bipolar disorder, unspecified (HCC)    Dementia (HCC)    Thyroid  disease    Past Surgical History:  Procedure Laterality Date   CHOLECYSTECTOMY     SHOULDER SURGERY Right    TONSILLECTOMY     Patient Active Problem List   Diagnosis Date Noted   Neuropathic pain of thigh, left 08/31/2023   Encounter for screening mammogram for malignant neoplasm of breast 08/31/2023   Chronic left SI joint pain 08/29/2023   Acute cough 08/29/2023   Visual disturbance 05/31/2023   Social anxiety disorder 01/10/2023   Right hand pain 01/10/2023   Chest pain of uncertain etiology 10/31/2022   DOE (dyspnea on exertion) 10/31/2022   Sinus bradycardia 10/31/2022   Irregular heartbeat 10/11/2022   Rash of face 07/11/2022   Left hand pain 07/11/2022   Screening for cervical cancer 01/08/2022   Cellulitis of face 01/08/2022   Osteopenia 01/08/2022   Elevated alkaline phosphatase level 01/08/2022   Gastroesophageal reflux disease 08/10/2021   Fracture, cervical vertebra (HCC) 03/28/2021   Encounter for general adult medical examination with abnormal findings 03/05/2021   Insomnia 03/05/2021   Vitamin D  deficiency 03/05/2021   Dementia (HCC) 03/05/2021   Bipolar disorder (HCC) 02/08/2019   Hypothyroidism 02/08/2019   Osteoporosis 02/08/2019     PCP: Zarwolo, Gloria, FNP  REFERRING PROVIDER: Brenna Lin, MD  REFERRING DIAG: [phone] (ICD-10-CM) - Chronic left-sided low back pain with left-sided sciatica  Rationale for Evaluation and Treatment: Rehabilitation  THERAPY DIAG:  Other low back pain  Pain in left hip  Difficulty in walking, not elsewhere classified  ONSET DATE: Summer 2024  SUBJECTIVE:  SUBJECTIVE STATEMENT: Doing well today and does not have pain except towards the end of the day.  EVAL: Arrives to the clinic with low back pain (see below). Denies any weakness and numbness on the legs. Condition started summer 2024 when she picked up her sister who fell. Did not hear any popping or clicking. Since then, pain has just gotten worse. Patient had an X-ray and thought that her issue is coming her low back. Patient was given injections on her buttocks which helped but did not last long. Patient was then referred to outpatient PT evaluation and management.  PERTINENT HISTORY:  Osteoporosis, bipolar disorder  PAIN:  Are you having pain? Yes: NPRS scale: 3 Pain location:  on the low back and L groin to the inner side of the L knee Pain description: aching pain Aggravating factors: evening, sitting for around 20 secs Relieving factors: morning, standing  PRECAUTIONS: None  RED FLAGS: None   WEIGHT BEARING RESTRICTIONS: No  FALLS:  Has patient fallen in last 6 months? Yes. 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  DIAGNOSTIC FINDINGS:  09/03/23 X-rays were done of the lumbar spine, five views.   Normal lumbar lordosis is present.  Disc spaces are well maintained.  No fracture is present.  Mild degenerative changes anteriorly are present.  Bone quality is good.   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  PATIENT SURVEYS:  Modified Oswestry 22/50 = 44%   COGNITION: Overall cognitive status: Within functional limits for tasks assessed     SENSATION: WFL  MUSCLE LENGTH: Hamstrings: moderate restriction on B Thomas test: moderate on L, mild on R Piriformis: difficult to assess due to pain  POSTURE: rounded shoulders, forward head, and decreased lumbar lordosis   LUMBAR ROM:   AROM eval  Flexion 100%  Extension 100%*  Right lateral flexion   Left lateral flexion   Right rotation 100%  Left rotation 100%   (Blank rows = not tested) *Extension aggravates low back pain  LOWER EXTREMITY ROM:     Active  Right eval Left eval  Hip flexion WFL 90  Hip extension [facility]  Hip abduction WFL 30  Hip adduction    Hip internal rotation WFL 30  Hip external rotation [facility] 30  Knee flexion [facility]  Knee extension [facility]  Ankle dorsiflexion [facility] WFL  Ankle plantarflexion [facility] WFL  Ankle inversion    Ankle eversion     (Blank rows = not tested)  LOWER EXTREMITY MMT:    MMT Right eval Left eval  Hip flexion 4 3+  Hip extension 4 4-  Hip abduction 4 3+  Hip adduction    Hip internal rotation    Hip external rotation    Knee flexion 5 5  Knee extension 5 3+  Ankle dorsiflexion 5 5  Ankle plantarflexion 5 5  Ankle inversion    Ankle eversion     (Blank rows = not tested)  LUMBAR SPECIAL TESTS:  R  SLR aggravated L hip pain, (+)  L FABER, (+) L FADIR, (-) prone knee bending test  FUNCTIONAL TESTS:  5 times sit to stand: 12.75 sec (pain hurts when sitting down) 2 minute walk test: 458 ft  GAIT: Distance walked: 458 ft Assistive device utilized: None Level of assistance: Complete Independence Comments: done during , with slight to mild antalgic gait  TREATMENT DATE:  09/30/23 NuStep, level 1, seat 10, 5' Grade 2 long axis distraction on L hip x 30 x 3 Quadruped rocking back/forth x 20 Seated L hip flexor stretch x 30 x 3 Bridging x 3 x 10 x 2 Standing I exercises, RTB x 3 x 10 x 2 LTR x 1'   09/25/23 Goal review Seated LAQ 10X5 each Supine: bridge 10X  SLR 10X each  Clam hip abd with GTB 10X 5 each  Hamstring stretch with strap 2X30    09/16/23 Evaluation and patient education done (40 minutes) Gait training with SPC using 2-point pattern for around 100 ft                                                                                                                               PATIENT EDUCATION:  Education details: Educated on the pathoanatomy of low back and hip pain. Educated on the goals and course of rehab.  Person educated: Patient Education method: Explanation Education comprehension: verbalized understanding  HOME EXERCISE PROGRAM: None provided at evaluation  Access Code: [email or web address] URL: [email or web address] 09/30/23 - Seated Hip Flexor Stretch  - 2 x daily - 7 x weekly - 3 reps - 30 hold - Quadruped Rocking Backward  - 2 x daily - 7 x weekly - 1 sets - 20 reps  Date: 09/25/2023 Prepared by: Greig Fuse Exercises - Seated Long Arc Quad  - 2 x daily - 7 x weekly - 10 reps - 5 hold - Beginner Bridge  - 2 x daily - 7 x weekly - 10 reps - 5 sec hold - Straight Leg Raise  - 2 x daily - 7 x weekly - 10 reps - Hooklying Clamshell with Resistance  - 2 x daily - 7 x weekly - 10 reps - 5 sec hold - Hooklying Hamstring Stretch with Strap  - 2 x daily - 7 x weekly - 3  reps - 30 sec hold  ASSESSMENT:  CLINICAL IMPRESSION: Interventions today were geared towards pain reduction, LE and core strengthening. Reported relief of pain/pressure on long axis distraction. Attempted MWM hip flex with lat distraction but it increased the pain so it was discontinued right away. Tolerated all activities without worsening of symptoms. Attempted quadruped leg ext but it increased the pain on the L wrist so it was discontinued. Demonstrated appropriate levels of fatigue. Provided slight amount of cueing to ensure correct execution of activity with good carry-over. Left the clinic without pain. To date, skilled PT is required to address the impairments and improve function  EVAL:  Patient is a 63 y.o. female who was seen today for physical therapy evaluation and treatment for low back pain. Patient's condition is further defined by difficulty with prolonged sitting due to pain, weakness, and decreased soft tissue extensibility. Skilled PT is required to address the impairments and functional limitations listed below. Based on the objective findings above, patient maybe presenting signs and symptoms of hip labral affectation. Patient may need to be seen by referring provider for further evaluation and management. Patient tolerated gait training with SPC with significant relief from pain. Patient gave excellent understanding when instructed on proper cane adjustment and measurement.    OBJECTIVE IMPAIRMENTS: Abnormal gait, decreased activity tolerance, difficulty walking, decreased ROM, decreased strength, impaired flexibility, and pain.   ACTIVITY LIMITATIONS: carrying, lifting, bending, and sitting  PARTICIPATION LIMITATIONS: meal prep, cleaning, laundry, driving, shopping, community activity, and yard work  PERSONAL FACTORS: Time since onset of injury/illness/exacerbation and osteoporosis  are also affecting patient's functional outcome.   REHAB POTENTIAL: Good  CLINICAL DECISION  MAKING: Stable/uncomplicated  EVALUATION COMPLEXITY: Low   GOALS: Goals reviewed with patient? Yes  SHORT TERM GOALS: Target date: 09/30/23  Pt will demonstrate indep in HEP to facilitate carry-over of skilled services and improve functional outcomes Goal status: IN PROGRESS  LONG TERM GOALS: Target date: 10/14/23  Pt will demonstrate a decrease in modified ODI score by 13-14% to demonstrate significant improvement in ADLs. Baseline: 44% Goal status: IN PROGRESS  2.  Pt will increase by at least 40 ft in order to demonstrate clinically significant improvement in community ambulation  Baseline: 458 ft Goal status: IN PROGRESS  3.  Pt will demonstrate increase in LE strength to 4/5 to facilitate ease and safety in ambulation Baseline: 3+/5 Goal status: IN PROGRESS  4.  Pt will be able to sit > 30 min with little to no pain (0-2/10) to facilitate ease in ADLs Baseline: 3/10 Goal status: IN PROGRESS   PLAN:  PT FREQUENCY: 2x/week  PT DURATION: 4 weeks  PLANNED INTERVENTIONS: 97164- PT Re-evaluation, 97110-Therapeutic exercises, 97530- Therapeutic activity, [id number]- Neuromuscular re-education, 97535- Self Care, [id number]- Manual therapy, [phone]- Gait training, and Patient/Family education.  PLAN FOR NEXT SESSION: Progress core and hip strengthening as well as flexibility activities.   Vinie CROME. Stavroula Rohde, PT, DPT, OCS Board-Certified Clinical Specialist in Orthopedic PT [facility] Ph: [phone] 09/30/2023, 9:00 AM

## 2023-10-02 ENCOUNTER — Ambulatory Visit (HOSPITAL_COMMUNITY): Payer: Medicaid Other

## 2023-10-02 DIAGNOSIS — R262 Difficulty in walking, not elsewhere classified: Secondary | ICD-10-CM

## 2023-10-02 DIAGNOSIS — M25552 Pain in left hip: Secondary | ICD-10-CM

## 2023-10-02 DIAGNOSIS — M5459 Other low back pain: Secondary | ICD-10-CM | POA: Diagnosis not present

## 2023-10-02 NOTE — Therapy (Signed)
 OUTPATIENT PHYSICAL THERAPY TREATMENT   Patient Name: Anita Berry MRN: [id number] DOB:[birth year], 63 y.o., female Today's Date: 10/02/2023  END OF SESSION:  PT End of Session - 10/02/23 0850     Visit Number 4    Number of Visits 8    Date for PT Re-Evaluation 10/14/23    Authorization Type [place] Medicaid [employer]    Authorization Time Period 12 visits approved 09/16/23-11/15/23    Authorization - Visit Number 3    Authorization - Number of Visits 12    PT Start Time 0845    PT Stop Time 0925    PT Time Calculation (min) 40 min    Activity Tolerance Patient tolerated treatment well    Behavior During Therapy WFL for tasks assessed/performed              Past Medical History:  Diagnosis Date   Bipolar 1 disorder (HCC)    Bipolar disorder, unspecified (HCC)    Dementia (HCC)    Thyroid  disease    Past Surgical History:  Procedure Laterality Date   CHOLECYSTECTOMY     SHOULDER SURGERY Right    TONSILLECTOMY     Patient Active Problem List   Diagnosis Date Noted   Neuropathic pain of thigh, left 08/31/2023   Encounter for screening mammogram for malignant neoplasm of breast 08/31/2023   Chronic left SI joint pain 08/29/2023   Acute cough 08/29/2023   Visual disturbance 05/31/2023   Social anxiety disorder 01/10/2023   Right hand pain 01/10/2023   Chest pain of uncertain etiology 10/31/2022   DOE (dyspnea on exertion) 10/31/2022   Sinus bradycardia 10/31/2022   Irregular heartbeat 10/11/2022   Rash of face 07/11/2022   Left hand pain 07/11/2022   Screening for cervical cancer 01/08/2022   Cellulitis of face 01/08/2022   Osteopenia 01/08/2022   Elevated alkaline phosphatase level 01/08/2022   Gastroesophageal reflux disease 08/10/2021   Fracture, cervical vertebra (HCC) 03/28/2021   Encounter for general adult medical examination with abnormal findings 03/05/2021   Insomnia 03/05/2021   Vitamin D  deficiency 03/05/2021   Dementia (HCC) 03/05/2021   Bipolar  disorder (HCC) 02/08/2019   Hypothyroidism 02/08/2019   Osteoporosis 02/08/2019    PCP: Zarwolo, Gloria, FNP  REFERRING PROVIDER: Brenna Lin, MD  REFERRING DIAG: [phone] (ICD-10-CM) - Chronic left-sided low back pain with left-sided sciatica  Rationale for Evaluation and Treatment: Rehabilitation  THERAPY DIAG:  No diagnosis found.  ONSET DATE: Summer 2024  SUBJECTIVE:  SUBJECTIVE STATEMENT: Current L groin pain = 3/10. Patient has been doing her HEP and has no issues with them except in the evening when she's hurting more.  EVAL: Arrives to the clinic with low back pain (see below). Denies any weakness and numbness on the legs. Condition started summer 2024 when she picked up her sister who fell. Did not hear any popping or clicking. Since then, pain has just gotten worse. Patient had an X-ray and thought that her issue is coming her low back. Patient was given injections on her buttocks which helped but did not last long. Patient was then referred to outpatient PT evaluation and management.  PERTINENT HISTORY:  Osteoporosis, bipolar disorder  PAIN:  Are you having pain? Yes: NPRS scale: 3 Pain location:  on the low back and L groin to the inner side of the L knee Pain description: aching pain Aggravating factors: evening, sitting for around 20 secs Relieving factors: morning, standing  PRECAUTIONS: None  RED FLAGS: None   WEIGHT BEARING RESTRICTIONS: No  FALLS:  Has patient fallen in last 6 months? Yes. Number of falls 6 (latest was a month ago)  LIVING ENVIRONMENT: Lives with:  sister (who has MS) Lives in: House/apartment Stairs: Yes: External: 15 steps; on right going up Has following equipment at home: Single point cane, Environmental Consultant - 4 wheeled, Wheelchair (power), and  Wheelchair (manual)  OCCUPATION: unemployed, take care of her sister  PLOF: Independent and Independent with basic ADLs  PATIENT GOALS: To find out where this pain is coming from  NEXT MD VISIT: 09/17/23  OBJECTIVE:  Note: Objective measures were completed at Evaluation unless otherwise noted.  DIAGNOSTIC FINDINGS:  09/03/23 X-rays were done of the lumbar spine, five views.   Normal lumbar lordosis is present.  Disc spaces are well maintained.  No fracture is present.  Mild degenerative changes anteriorly are present.  Bone quality is good.   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  PATIENT SURVEYS:  Modified Oswestry 22/50 = 44%   COGNITION: Overall cognitive status: Within functional limits for tasks assessed     SENSATION: WFL  MUSCLE LENGTH: Hamstrings: moderate restriction on B Thomas test: moderate on L, mild on R Piriformis: difficult to assess due to pain  POSTURE: rounded shoulders, forward head, and decreased lumbar lordosis   LUMBAR ROM:   AROM eval  Flexion 100%  Extension 100%*  Right lateral flexion   Left lateral flexion   Right rotation 100%  Left rotation 100%   (Blank rows = not tested) *Extension aggravates low back pain  LOWER EXTREMITY ROM:     Active  Right eval Left eval  Hip flexion WFL 90  Hip extension [facility]  Hip abduction WFL 30  Hip adduction    Hip internal rotation WFL 30  Hip external rotation [facility] 30  Knee flexion [facility]  Knee extension [facility]  Ankle dorsiflexion [facility]  Ankle plantarflexion [facility] WFL  Ankle inversion    Ankle eversion     (Blank rows = not tested)  LOWER EXTREMITY MMT:    MMT Right eval Left eval  Hip flexion 4 3+  Hip extension 4 4-  Hip abduction 4 3+  Hip adduction    Hip internal rotation    Hip external rotation    Knee flexion 5 5  Knee extension 5 3+  Ankle dorsiflexion 5 5  Ankle plantarflexion 5 5  Ankle inversion    Ankle eversion     (  Blank rows = not  tested)  LUMBAR SPECIAL TESTS:  R SLR aggravated L hip pain, (+) L FABER, (+) L FADIR, (-) prone knee bending test  FUNCTIONAL TESTS:  5 times sit to stand: 12.75 sec (pain hurts when sitting down) 2 minute walk test: 458 ft  GAIT: Distance walked: 458 ft Assistive device utilized: None Level of assistance: Complete Independence Comments: done during , with slight to mild antalgic gait  TREATMENT DATE:  10/02/23 NuStep, level 2, seat 10, 5' Grade 2 long axis distraction on L hip x 30 x 3 x 2 Quadruped rocking back/forth x 20 Seated L hip flexor stretch x 30 x 3 Bridging with hip abd, RTB, x 3 x 10 x 2 Standing hip ext, RTB x 10 x 2 Clamshells, RTB x 10 x 2 Seated abdominal press with physioball, neutral spine x 10 x 2 x 3  09/30/23 NuStep, level 1, seat 10, 5' Grade 2 long axis distraction on L hip x 30 x 3 Quadruped rocking back/forth x 20 Seated L hip flexor stretch x 30 x 3 Bridging x 3 x 10 x 2 Standing I exercises, RTB x 3 x 10 x 2 LTR x 1'   09/25/23 Goal review Seated LAQ 10X5 each Supine: bridge 10X  SLR 10X each  Clam hip abd with GTB 10X 5 each  Hamstring stretch with strap 2X30    09/16/23 Evaluation and patient education done (40 minutes) Gait training with SPC using 2-point pattern for around 100 ft                                                                                                                               PATIENT EDUCATION:  Education details: Educated on the pathoanatomy of low back and hip pain. Educated on the goals and course of rehab.  Person educated: Patient Education method: Explanation Education comprehension: verbalized understanding  HOME EXERCISE PROGRAM: None provided at evaluation  Access Code: [email or web address] URL: [email or web address] 10/02/23 - Clam with Resistance  - 2 x daily - 7 x weekly - 2 sets - 10 reps - Seated Abdominal Press into [employer]  - 2 x daily - 7 x weekly - 2 sets - 10 reps  - 3 hold  09/30/23 - Seated Hip Flexor Stretch  - 2 x daily - 7 x weekly - 3 reps - 30 hold - Quadruped Rocking Backward  - 2 x daily - 7 x weekly - 1 sets - 20 reps  Date: 09/25/2023 Prepared by: Greig Fuse Exercises - Seated Long Arc Quad  - 2 x daily - 7 x weekly - 10 reps - 5 hold - Beginner Bridge  - 2 x daily - 7 x weekly - 10 reps - 5 sec hold - Straight Leg Raise  - 2 x daily - 7 x weekly - 10 reps - Hooklying Clamshell with Resistance  - 2 x daily - 7 x weekly - 10 reps -  5 sec hold - Hooklying Hamstring Stretch with Strap  - 2 x daily - 7 x weekly - 3 reps - 30 sec hold  ASSESSMENT:  CLINICAL IMPRESSION: Interventions today were geared towards pain reduction, LE and core strengthening. Reported relief of pain/pressure on long axis distraction. Attempted to do standing hip abd and LTR but it increased hip pain to 6/10 so it was discontinued. Tolerated all activities without worsening of symptoms except standing hip ext where patient reported of slight increase in pain. Demonstrated appropriate levels of fatigue. Provided slight amount of cueing to ensure correct execution of activity with good carry-over. Left the clinic without pain. To date, skilled PT is required to address the impairments and improve function  EVAL: Patient is a 63 y.o. female who was seen today for physical therapy evaluation and treatment for low back pain. Patient's condition is further defined by difficulty with prolonged sitting due to pain, weakness, and decreased soft tissue extensibility. Skilled PT is required to address the impairments and functional limitations listed below. Based on the objective findings above, patient maybe presenting signs and symptoms of hip labral affectation. Patient may need to be seen by referring provider for further evaluation and management. Patient tolerated gait training with SPC with significant relief from pain. Patient gave excellent understanding when instructed on proper  cane adjustment and measurement.    OBJECTIVE IMPAIRMENTS: Abnormal gait, decreased activity tolerance, difficulty walking, decreased ROM, decreased strength, impaired flexibility, and pain.   ACTIVITY LIMITATIONS: carrying, lifting, bending, and sitting  PARTICIPATION LIMITATIONS: meal prep, cleaning, laundry, driving, shopping, community activity, and yard work  PERSONAL FACTORS: Time since onset of injury/illness/exacerbation and osteoporosis  are also affecting patient's functional outcome.   REHAB POTENTIAL: Good  CLINICAL DECISION MAKING: Stable/uncomplicated  EVALUATION COMPLEXITY: Low   GOALS: Goals reviewed with patient? Yes  SHORT TERM GOALS: Target date: 09/30/23  Pt will demonstrate indep in HEP to facilitate carry-over of skilled services and improve functional outcomes Goal status: IN PROGRESS  LONG TERM GOALS: Target date: 10/14/23  Pt will demonstrate a decrease in modified ODI score by 13-14% to demonstrate significant improvement in ADLs. Baseline: 44% Goal status: IN PROGRESS  2.  Pt will increase by at least 40 ft in order to demonstrate clinically significant improvement in community ambulation  Baseline: 458 ft Goal status: IN PROGRESS  3.  Pt will demonstrate increase in LE strength to 4/5 to facilitate ease and safety in ambulation Baseline: 3+/5 Goal status: IN PROGRESS  4.  Pt will be able to sit > 30 min with little to no pain (0-2/10) to facilitate ease in ADLs Baseline: 3/10 Goal status: IN PROGRESS   PLAN:  PT FREQUENCY: 2x/week  PT DURATION: 4 weeks  PLANNED INTERVENTIONS: 97164- PT Re-evaluation, 97110-Therapeutic exercises, 97530- Therapeutic activity, [id number]- Neuromuscular re-education, 97535- Self Care, 02859- Manual therapy, [phone]- Gait training, and Patient/Family education.  PLAN FOR NEXT SESSION: Progress core and hip strengthening as well as flexibility activities.   Vinie CROME. Ileene Allie, PT, DPT, OCS Board-Certified  Clinical Specialist in Orthopedic PT [facility] Ph: [phone] 10/02/2023, 8:51 AM

## 2023-10-07 ENCOUNTER — Ambulatory Visit (HOSPITAL_COMMUNITY): Payer: Medicaid Other | Admitting: Physical Therapy

## 2023-10-07 ENCOUNTER — Telehealth: Payer: Self-pay | Admitting: Orthopaedic Surgery

## 2023-10-07 DIAGNOSIS — M25552 Pain in left hip: Secondary | ICD-10-CM

## 2023-10-07 DIAGNOSIS — M5459 Other low back pain: Secondary | ICD-10-CM

## 2023-10-07 DIAGNOSIS — R262 Difficulty in walking, not elsewhere classified: Secondary | ICD-10-CM

## 2023-10-07 NOTE — Telephone Encounter (Signed)
Dr. Sanjuan Dame pt - spoke w/the patient, she has a MRI scheduled for 3/05 and is requesting valium be sent to [facility] [place].

## 2023-10-07 NOTE — Therapy (Signed)
OUTPATIENT PHYSICAL THERAPY TREATMENT   Patient Name: Anita Berry MRN: [id number] DOB:1961-07-04, 63 y.o., female Today's Date: 10/07/2023  END OF SESSION:  PT End of Session - 10/07/23 0933     Visit Number 5    Number of Visits 8    Date for PT Re-Evaluation 10/14/23    Authorization Type [place] Medicaid [employer]    Authorization Time Period 12 visits approved 09/16/23-11/15/23    Authorization - Visit Number 5    Authorization - Number of Visits 12    PT Start Time 0849    PT Stop Time 0932    PT Time Calculation (min) 43 min    Activity Tolerance Patient tolerated treatment well    Behavior During Therapy WFL for tasks assessed/performed               Past Medical History:  Diagnosis Date   Bipolar 1 disorder (HCC)    Bipolar disorder, unspecified (HCC)    Dementia (HCC)    Thyroid disease    Past Surgical History:  Procedure Laterality Date   CHOLECYSTECTOMY     SHOULDER SURGERY Right    TONSILLECTOMY     Patient Active Problem List   Diagnosis Date Noted   Neuropathic pain of thigh, left 08/31/2023   Encounter for screening mammogram for malignant neoplasm of breast 08/31/2023   Chronic left SI joint pain 08/29/2023   Acute cough 08/29/2023   Visual disturbance 05/31/2023   Social anxiety disorder 01/10/2023   Right hand pain 01/10/2023   Chest pain of uncertain etiology 10/31/2022   DOE (dyspnea on exertion) 10/31/2022   Sinus bradycardia 10/31/2022   Irregular heartbeat 10/11/2022   Rash of face 07/11/2022   Left hand pain 07/11/2022   Screening for cervical cancer 01/08/2022   Cellulitis of face 01/08/2022   Osteopenia 01/08/2022   Elevated alkaline phosphatase level 01/08/2022   Gastroesophageal reflux disease 08/10/2021   Fracture, cervical vertebra (HCC) 03/28/2021   Encounter for general adult medical examination with abnormal findings 03/05/2021   Insomnia 03/05/2021   Vitamin D deficiency 03/05/2021   Dementia (HCC) 03/05/2021   Bipolar  disorder (HCC) 02/08/2019   Hypothyroidism 02/08/2019   Osteoporosis 02/08/2019    PCP: Gilmore Laroche, FNP  REFERRING PROVIDER: Darreld Mclean, MD  REFERRING DIAG: [phone] (ICD-10-CM) - Chronic left-sided low back pain with left-sided sciatica  Rationale for Evaluation and Treatment: Rehabilitation  THERAPY DIAG:  Pain in left hip  Difficulty in walking, not elsewhere classified  Other low back pain  ONSET DATE: Summer 2024  SUBJECTIVE:  SUBJECTIVE STATEMENT: 10/07/23: Pt states she has no pain and doing well.   EVAL: Arrives to the clinic with low back pain (see below). Denies any weakness and numbness on the legs. Condition started summer 2024 when she picked up her sister who fell. Did not hear any popping or clicking. Since then, pain has just gotten worse. Patient had an X-ray and thought that her issue is coming her low back. Patient was given injections on her buttocks which helped but did not last long. Patient was then referred to outpatient PT evaluation and management.  PERTINENT HISTORY:  Osteoporosis, bipolar disorder  PAIN:  Are you having pain? No  PRECAUTIONS: None  RED FLAGS: None   WEIGHT BEARING RESTRICTIONS: No  FALLS:  Has patient fallen in last 6 months? Yes. 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  DIAGNOSTIC FINDINGS:  09/03/23 X-rays were done of the lumbar spine, five views.   Normal lumbar lordosis is present.  Disc spaces are well maintained.  No fracture is present.  Mild degenerative changes anteriorly are present.  Bone quality is good.   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  PATIENT SURVEYS:  Modified Oswestry 22/50 = 44%   COGNITION: Overall cognitive status: Within functional limits for tasks assessed     SENSATION: WFL  MUSCLE LENGTH: Hamstrings: moderate restriction on B Thomas test: moderate on L, mild on R Piriformis: difficult to assess due to pain  POSTURE: rounded shoulders, forward head, and decreased lumbar lordosis   LUMBAR ROM:   AROM eval  Flexion 100%  Extension 100%*  Right lateral flexion   Left lateral flexion   Right rotation 100%  Left rotation 100%   (Blank rows = not tested) *Extension aggravates low back pain  LOWER EXTREMITY ROM:     Active  Right eval Left eval  Hip flexion WFL 90  Hip extension [facility]  Hip abduction WFL 30  Hip adduction    Hip internal rotation WFL 30  Hip external rotation [facility] 30  Knee flexion [facility] WFL  Knee extension [facility]  Ankle dorsiflexion [facility] WFL  Ankle plantarflexion [facility] WFL  Ankle inversion    Ankle eversion     (Blank rows = not tested)  LOWER EXTREMITY MMT:    MMT Right eval Left eval  Hip flexion 4 3+  Hip extension 4 4-  Hip abduction 4 3+  Hip adduction    Hip internal rotation    Hip external rotation    Knee flexion 5 5  Knee extension 5 3+  Ankle dorsiflexion 5 5  Ankle plantarflexion 5 5  Ankle inversion    Ankle eversion     (Blank rows = not tested)  LUMBAR SPECIAL TESTS:  R SLR aggravated L hip pain, (+) L FABER, (+) L FADIR, (-) prone knee bending test  FUNCTIONAL TESTS:  5 times sit to stand: 12.75 sec (pain hurts when sitting down) 2 minute walk test: 458  ft  GAIT:  Distance walked: 458 ft Assistive device utilized: None Level of assistance: Complete Independence Comments: done during , with slight to mild antalgic gait  TREATMENT DATE:  10/07/23 NuStep, level 4, seat 6, UE/LE 5' Standing:  hip abduction 3# 2X10 each  Hip extension 3# 2X10 each  March hold 3# 2X10 each  Lunges onto 4" step no UE 2X10 each  Eccentric lowering laterally 4" 2X10 each  Eccentric lowering forward step down 4" 2X10 each Quadruped rocking back/forth x 20 UE alternating lifts 10X LE alternating lifts 10X Supine: abdominal iso bracing 10X Physioball bridge 10X Abdominal crunch with ball 10X  10/02/23 NuStep, level 2, seat 10, 5' Grade 2 long axis distraction on L hip x 30" x 3 x 2 Quadruped rocking back/forth x 20 Seated L hip flexor stretch x 30" x 3 Bridging with hip abd, RTB, x 3" x 10 x 2 Standing hip ext, RTB x 10 x 2 Clamshells, RTB x 10 x 2 Seated abdominal press with physioball, neutral spine x 10 x 2 x 3"  09/30/23 NuStep, level 1, seat 10, 5' Grade 2 long axis distraction on L hip x 30" x 3 Quadruped rocking back/forth x 20 Seated L hip flexor stretch x 30" x 3 Bridging x 3" x 10 x 2 Standing I exercises, RTB x 3 "x 10 x 2 LTR x 1'   09/25/23 Goal review Seated LAQ 10X5" each Supine: bridge 10X  SLR 10X each  Clam hip abd with GTB 10X 5" each  Hamstring stretch with strap 2X30"    09/16/23 Evaluation and patient education done (40 minutes) Gait training with SPC using 2-point pattern for around 100 ft                                                                                                                               PATIENT EDUCATION:  Education details: Educated on the pathoanatomy of low back and hip pain. Educated on the goals and course of rehab.  Person educated: Patient Education method: Explanation Education comprehension: verbalized understanding  HOME EXERCISE PROGRAM: None provided at evaluation  Access  Code: [email or web address] URL: [email or web address] 10/02/23 - Clam with Resistance  - 2 x daily - 7 x weekly - 2 sets - 10 reps - Seated Abdominal Press into [employer]  - 2 x daily - 7 x weekly - 2 sets - 10 reps - 3 hold  09/30/23 - Seated Hip Flexor Stretch  - 2 x daily - 7 x weekly - 3 reps - 30 hold - Quadruped Rocking Backward  - 2 x daily - 7 x weekly - 1 sets - 20 reps  Date: 09/25/2023 Prepared by: Emeline Gins Exercises - Seated Long Arc Quad  - 2 x daily - 7 x weekly - 10 reps - 5" hold - Beginner Bridge  - 2 x daily - 7 x weekly - 10 reps - 5 sec hold - Straight Leg Raise  -  2 x daily - 7 x weekly - 10 reps - Hooklying Clamshell with Resistance  - 2 x daily - 7 x weekly - 10 reps - 5 sec hold - Hooklying Hamstring Stretch with Strap  - 2 x daily - 7 x weekly - 3 reps - 30 sec hold  ASSESSMENT:  CLINICAL IMPRESSION: Progressed standing stabilization/strengthening exercises this session.  PT able to complete these without c/o pain as previous appointment.  Rt forward lunge more challenging having to use UE intermittently.  Began eccentric lowering using 4" step with good form/control. Cues needed to complete hip therex more slowly/controlled with upright posturing. No pain or changes reported at end of session. Pt will continue to benefit from skilled therapy to address her impairments and improve overall function.  EVAL: Patient is a 63 y.o. female who was seen today for physical therapy evaluation and treatment for low back pain. Patient's condition is further defined by difficulty with prolonged sitting due to pain, weakness, and decreased soft tissue extensibility. Skilled PT is required to address the impairments and functional limitations listed below. Based on the objective findings above, patient maybe presenting signs and symptoms of hip labral affectation. Patient may need to be seen by referring provider for further evaluation and management. Patient tolerated gait  training with SPC with significant relief from pain. Patient gave excellent understanding when instructed on proper cane adjustment and measurement.    OBJECTIVE IMPAIRMENTS: Abnormal gait, decreased activity tolerance, difficulty walking, decreased ROM, decreased strength, impaired flexibility, and pain.   ACTIVITY LIMITATIONS: carrying, lifting, bending, and sitting  PARTICIPATION LIMITATIONS: meal prep, cleaning, laundry, driving, shopping, community activity, and yard work  PERSONAL FACTORS: Time since onset of injury/illness/exacerbation and osteoporosis  are also affecting patient's functional outcome.   REHAB POTENTIAL: Good  CLINICAL DECISION MAKING: Stable/uncomplicated  EVALUATION COMPLEXITY: Low   GOALS: Goals reviewed with patient? Yes  SHORT TERM GOALS: Target date: 09/30/23  Pt will demonstrate indep in HEP to facilitate carry-over of skilled services and improve functional outcomes Goal status: IN PROGRESS  LONG TERM GOALS: Target date: 10/14/23  Pt will demonstrate a decrease in modified ODI score by 13-14% to demonstrate significant improvement in ADLs. Baseline: 44% Goal status: IN PROGRESS  2.  Pt will increase by at least 40 ft in order to demonstrate clinically significant improvement in community ambulation  Baseline: 458 ft Goal status: IN PROGRESS  3.  Pt will demonstrate increase in LE strength to 4/5 to facilitate ease and safety in ambulation Baseline: 3+/5 Goal status: IN PROGRESS  4.  Pt will be able to sit > 30 min with little to no pain (0-2/10) to facilitate ease in ADLs Baseline: 3/10 Goal status: IN PROGRESS   PLAN:  PT FREQUENCY: 2x/week  PT DURATION: 4 weeks  PLANNED INTERVENTIONS: 97164- PT Re-evaluation, 97110-Therapeutic exercises, 97530- Therapeutic activity, [id number]- Neuromuscular re-education, 97535- Self Care, 16109- Manual therapy, [phone]- Gait training, and Patient/Family education.  PLAN FOR NEXT SESSION: Progress core  and hip strengthening as well as flexibility activities.   Lurena Nida, PTA/CLT [facility] Ph: [phone]  10/07/2023, 9:36 AM

## 2023-10-08 ENCOUNTER — Other Ambulatory Visit: Payer: Self-pay | Admitting: Family Medicine

## 2023-10-08 DIAGNOSIS — F3131 Bipolar disorder, current episode depressed, mild: Secondary | ICD-10-CM

## 2023-10-08 MED ORDER — DIAZEPAM 10 MG PO TABS: 10.0000 mg | ORAL_TABLET | Freq: Once | ORAL | 0 refills | Status: AC

## 2023-10-09 ENCOUNTER — Encounter (HOSPITAL_COMMUNITY): Payer: Medicaid Other | Admitting: Physical Therapy

## 2023-10-14 ENCOUNTER — Ambulatory Visit (HOSPITAL_COMMUNITY): Payer: Medicaid Other

## 2023-10-14 DIAGNOSIS — M5459 Other low back pain: Secondary | ICD-10-CM | POA: Diagnosis not present

## 2023-10-14 DIAGNOSIS — R262 Difficulty in walking, not elsewhere classified: Secondary | ICD-10-CM

## 2023-10-14 DIAGNOSIS — M25552 Pain in left hip: Secondary | ICD-10-CM

## 2023-10-14 NOTE — Therapy (Signed)
OUTPATIENT PHYSICAL THERAPY PROGRESS NOTE   Patient Name: Anita Berry MRN: [id number] DOB:June 12, 1961, 63 y.o., female Today's Date: 10/14/2023  END OF SESSION:  PT End of Session - 10/14/23 0850     Visit Number 6    Number of Visits 14    Date for PT Re-Evaluation 11/11/23    Authorization Type [place] Medicaid [employer]    Authorization Time Period 12 visits approved 09/16/23-11/15/23    Authorization - Visit Number 6    Authorization - Number of Visits 12    PT Start Time 0845    PT Stop Time 0930    PT Time Calculation (min) 45 min    Activity Tolerance Patient tolerated treatment well    Behavior During Therapy WFL for tasks assessed/performed                Past Medical History:  Diagnosis Date   Bipolar 1 disorder (HCC)    Bipolar disorder, unspecified (HCC)    Dementia (HCC)    Thyroid disease    Past Surgical History:  Procedure Laterality Date   CHOLECYSTECTOMY     SHOULDER SURGERY Right    TONSILLECTOMY     Patient Active Problem List   Diagnosis Date Noted   Neuropathic pain of thigh, left 08/31/2023   Encounter for screening mammogram for malignant neoplasm of breast 08/31/2023   Chronic left SI joint pain 08/29/2023   Acute cough 08/29/2023   Visual disturbance 05/31/2023   Social anxiety disorder 01/10/2023   Right hand pain 01/10/2023   Chest pain of uncertain etiology 10/31/2022   DOE (dyspnea on exertion) 10/31/2022   Sinus bradycardia 10/31/2022   Irregular heartbeat 10/11/2022   Rash of face 07/11/2022   Left hand pain 07/11/2022   Screening for cervical cancer 01/08/2022   Cellulitis of face 01/08/2022   Osteopenia 01/08/2022   Elevated alkaline phosphatase level 01/08/2022   Gastroesophageal reflux disease 08/10/2021   Fracture, cervical vertebra (HCC) 03/28/2021   Encounter for general adult medical examination with abnormal findings 03/05/2021   Insomnia 03/05/2021   Vitamin D deficiency 03/05/2021   Dementia (HCC) 03/05/2021    Bipolar disorder (HCC) 02/08/2019   Hypothyroidism 02/08/2019   Osteoporosis 02/08/2019  Progress Note Reporting Period 09/16/23 to 10/14/23  See note below for Objective Data and Assessment of Progress/Goals.      PCP: Gilmore Laroche, FNP  REFERRING PROVIDER: Darreld Mclean, MD  REFERRING DIAG: [phone] (ICD-10-CM) - Chronic left-sided low back pain with left-sided sciatica  Rationale for Evaluation and Treatment: Rehabilitation  THERAPY DIAG:  Pain in left hip  Difficulty in walking, not elsewhere classified  Other low back pain  ONSET DATE: Summer 2024  SUBJECTIVE:  SUBJECTIVE STATEMENT: PROGRESS NOTE 10/14/23: Currently reports of pain = 5/10 on the L hip (groin area). L side of the low back catches when she tries to get back up. Patient thinks that PT has helped her so far until this weekend. Patient doesn't know which could have aggravated the pain. Patient thinks that she is around 25% better and wants to continue with PT so she can be able to bend and stand back up.  EVAL: Arrives to the clinic with low back pain (see below). Denies any weakness and numbness on the legs. Condition started summer 2024 when she picked up her sister who fell. Did not hear any popping or clicking. Since then, pain has just gotten worse. Patient had an X-ray and thought that her issue is coming her low back. Patient was given injections on her buttocks which helped but did not last long. Patient was then referred to outpatient PT evaluation and management.  PERTINENT HISTORY:  Osteoporosis, bipolar disorder  PAIN:  Are you having pain? No  PRECAUTIONS: None  RED FLAGS: None   WEIGHT BEARING RESTRICTIONS: No  FALLS:  Has patient fallen in last 6 months? Yes. 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  DIAGNOSTIC FINDINGS:  09/03/23 X-rays were done of the lumbar spine, five views.   Normal lumbar lordosis is present.  Disc spaces are well maintained.  No fracture is present.  Mild degenerative changes anteriorly are present.  Bone quality is good.   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  PATIENT SURVEYS:  Modified Oswestry 10/14/23: 20/50 = 40% from 22/50 = 44%   COGNITION: Overall cognitive status: Within functional limits for tasks assessed     SENSATION: WFL  MUSCLE LENGTH: Hamstrings: moderate restriction on B Thomas test: moderate on L, mild on R Piriformis: difficult to assess due to pain  POSTURE: rounded shoulders, forward head, and decreased lumbar lordosis 10/14/23: R LE longer in supine, got shorter in supine to sit   LUMBAR ROM:   AROM eval 10/14/23  Flexion 100% 100%  Extension 100%* 100%*  Right lateral flexion    Left lateral flexion    Right rotation 100% 100%  Left rotation 100% 100%   (Blank rows = not tested) *Extension aggravates low back pain  LOWER EXTREMITY ROM:     Active  Right eval Left eval Left 10/14/23  Hip flexion WFL 90 90  Hip extension [facility] WFL  Hip abduction WFL 30 45  Hip adduction     Hip internal rotation WFL 30 35  Hip external rotation WFL 30 30  Knee flexion [facility]   Knee extension [facility]   Ankle dorsiflexion [facility]   Ankle plantarflexion [facility]   Ankle inversion      Ankle eversion      (Blank rows = not tested)  LOWER EXTREMITY MMT:    MMT Right eval Left eval Right 10/14/23 Left 10/14/23  Hip flexion 4 3+ 4 4-  Hip  extension 4 4- 4 4-  Hip abduction 4 3+ 4 4-  Hip adduction      Hip internal rotation      Hip external rotation      Knee flexion 5 5 5 5   Knee extension 5 3+ 5 5  Ankle dorsiflexion 5 5 5 5   Ankle plantarflexion 5 5 5 5   Ankle inversion      Ankle eversion       (Blank rows = not tested)  LUMBAR SPECIAL TESTS:  R SLR aggravated L hip pain, (+) L FABER, (+) L FADIR, (-) prone knee bending test  10/14/23: (-) distraction/compression test on SI joint  FUNCTIONAL TESTS:  5 times sit to stand: 10/14/23: 10.08 sec with pain when standing up from 12.75 sec (pain hurts when sitting down) 2 minute walk test: 10/14/23: 547 ft from 458 ft  GAIT: 10/14/23 Distance walked: 547 ft Assistive device utilized: None Level of assistance: Complete Independence Comments: done during , with slight antalgic gait  TREATMENT DATE:  10/14/23 Progress note (modified Oswestry, , 5TSTS, ROM, MMT, pelvic assessment) Self-MET for the R anterior innominate x 6" x 10  Bridging with hip abd, GTB, x 3" x 10 x 2 SLR x 10 x 2 Hooklying hip adductor squeeze x 3" x 10 x 2 NuStep, level 4, seat 6, 3'  10/07/23 NuStep, level 4, seat 6, UE/LE 5' Standing:  hip abduction 3# 2X10 each  Hip extension 3# 2X10 each  March hold 3# 2X10 each  Lunges onto 4" step no UE 2X10 each  Eccentric lowering laterally 4" 2X10 each  Eccentric lowering forward step down 4" 2X10 each Quadruped rocking back/forth x 20 UE alternating lifts 10X LE alternating lifts 10X Supine: abdominal iso bracing 10X Physioball bridge 10X Abdominal crunch with ball 10X  10/02/23 NuStep, level 2, seat 10, 5' Grade 2 long axis distraction on L hip x 30" x 3 x 2 Quadruped rocking back/forth x 20 Seated L hip flexor stretch x 30" x 3 Bridging with hip abd, RTB, x 3" x 10 x  2 Standing hip ext, RTB x 10 x 2 Clamshells, RTB x 10 x 2 Seated abdominal press with physioball, neutral spine x 10 x 2 x 3"  09/30/23 NuStep, level 1, seat 10, 5' Grade 2 long axis distraction on L hip x 30" x 3 Quadruped rocking back/forth x 20 Seated L hip flexor stretch x 30" x 3 Bridging x 3" x 10 x 2 Standing I exercises, RTB x 3 "x 10 x 2 LTR x 1'   09/25/23 Goal review Seated LAQ 10X5" each Supine: bridge 10X  SLR 10X each  Clam hip abd with GTB 10X 5" each  Hamstring stretch with strap 2X30"    09/16/23 Evaluation and patient education done (40 minutes) Gait training with SPC using 2-point pattern for around 100 ft  PATIENT EDUCATION:  Education details: Educated on the pathoanatomy of low back and hip pain. Educated on the goals and course of rehab.  Person educated: Patient Education method: Explanation Education comprehension: verbalized understanding  HOME EXERCISE PROGRAM: Access Code: [email or web address] URL: [email or web address] 10/14/23 - Supine Hip Adduction Isometric with Ball  - 1 x daily - 7 x weekly - 2 sets - 10 reps - 3 hold  10/02/23 - Clam with Resistance  - 2 x daily - 7 x weekly - 2 sets - 10 reps - Seated Abdominal Press into [employer]  - 2 x daily - 7 x weekly - 2 sets - 10 reps - 3 hold  09/30/23 - Seated Hip Flexor Stretch  - 2 x daily - 7 x weekly - 3 reps - 30 hold - Quadruped Rocking Backward  - 2 x daily - 7 x weekly - 1 sets - 20 reps  Date: 09/25/2023 Prepared by: Emeline Gins Exercises - Seated Long Arc Quad  - 2 x daily - 7 x weekly - 10 reps - 5" hold - Beginner Bridge  - 2 x daily - 7 x weekly - 10 reps - 5 sec hold - Straight Leg Raise  - 2 x daily - 7 x weekly - 10 reps - Hooklying Clamshell with Resistance  - 2 x daily - 7 x weekly - 10 reps - 5 sec hold - Hooklying Hamstring Stretch with Strap  - 2  x daily - 7 x weekly - 3 reps - 30 sec hold  ASSESSMENT:  CLINICAL IMPRESSION: PROGRESS NOTE 10/14/23: Patient demonstrated continued improvements in function as indicated by positive significant changes in . However, patient still presents with deficits in strength. No significant improvements were also seen on the modified ODI and 5TSTS With this, skilled PT is still required to address the impairments and functional limitations listed below. Based on the objective findings above, patient may be presenting some R ant innominate issues. Interventions today were geared towards pelvic alignment, core and hip strengthening. Tolerated all activities without worsening of symptoms. B medial malleoli were even after the self-MET. Demonstrated appropriate levels of fatigue. Provided slight amount of cueing to ensure correct execution of activity with good carry-over.    EVAL: Patient is a 63 y.o. female who was seen today for physical therapy evaluation and treatment for low back pain. Patient's condition is further defined by difficulty with prolonged sitting due to pain, weakness, and decreased soft tissue extensibility. Skilled PT is required to address the impairments and functional limitations listed below. Based on the objective findings above, patient maybe presenting signs and symptoms of hip labral affectation. Patient may need to be seen by referring provider for further evaluation and management. Patient tolerated gait training with SPC with significant relief from pain. Patient gave excellent understanding when instructed on proper cane adjustment and measurement.    OBJECTIVE IMPAIRMENTS: Abnormal gait, decreased activity tolerance, difficulty walking, decreased ROM, decreased strength, impaired flexibility, and pain.   ACTIVITY LIMITATIONS: carrying, lifting, bending, and sitting  PARTICIPATION LIMITATIONS: meal prep, cleaning, laundry, driving, shopping, community activity, and yard  work  PERSONAL FACTORS: Time since onset of injury/illness/exacerbation and osteoporosis  are also affecting patient's functional outcome.   REHAB POTENTIAL: Good     GOALS: Goals reviewed with patient? Yes  SHORT TERM GOALS: Target date: 09/30/23  Pt will demonstrate indep in HEP to facilitate carry-over of skilled services and improve functional outcomes Goal status: MET  LONG TERM GOALS: Target date: 11/11/23  Pt will demonstrate a decrease in modified ODI score by 13-14% to demonstrate significant improvement in ADLs. Baseline: 44% Goal status: IN PROGRESS  2.  Pt will increase by at least 40 ft in order to demonstrate clinically significant improvement in community ambulation  Baseline: 458 ft Goal status: MET  3.  Pt will demonstrate increase in LE strength to 4/5 to facilitate ease and safety in ambulation Baseline: 3+/5 Goal status: IN PROGRESS  4.  Pt will be able to sit > 30 min with little to no pain (0-2/10) to facilitate ease in ADLs Baseline: 3/10 Goal status: IN PROGRESS   PLAN:  PT FREQUENCY: 2x/week  PT DURATION: 4 weeks  PLANNED INTERVENTIONS: 97164- PT Re-evaluation, 97110-Therapeutic exercises, 97530- Therapeutic activity, [id number]- Neuromuscular re-education, 97535- Self Care, 16109- Manual therapy, [phone]- Gait training, and Patient/Family education.  PLAN FOR NEXT SESSION: Progress core and hip strengthening as well as flexibility activities.   Tish Frederickson. Gwenna Fuston, PT, DPT, OCS Board-Certified Clinical Specialist in Orthopedic PT PT Compact Privilege # ([place]): [id number] T 10/14/2023, 9:32 AM

## 2023-10-15 ENCOUNTER — Ambulatory Visit: Payer: Medicaid Other | Admitting: Orthopaedic Surgery

## 2023-10-15 ENCOUNTER — Other Ambulatory Visit (HOSPITAL_COMMUNITY): Payer: Medicaid Other

## 2023-10-15 ENCOUNTER — Ambulatory Visit (HOSPITAL_COMMUNITY): Payer: Medicaid Other

## 2023-10-15 ENCOUNTER — Telehealth: Payer: Self-pay | Admitting: Orthopaedic Surgery

## 2023-10-15 ENCOUNTER — Encounter (HOSPITAL_COMMUNITY): Payer: Self-pay

## 2023-10-15 ENCOUNTER — Encounter: Payer: Self-pay | Admitting: Orthopaedic Surgery

## 2023-10-15 VITALS — BP 135/90 | HR 63

## 2023-10-15 DIAGNOSIS — M5442 Lumbago with sciatica, left side: Secondary | ICD-10-CM

## 2023-10-15 DIAGNOSIS — M654 Radial styloid tenosynovitis [de Quervain]: Secondary | ICD-10-CM | POA: Diagnosis not present

## 2023-10-15 DIAGNOSIS — G8929 Other chronic pain: Secondary | ICD-10-CM

## 2023-10-15 DIAGNOSIS — M25552 Pain in left hip: Secondary | ICD-10-CM

## 2023-10-15 MED ORDER — METHYLPREDNISOLONE ACETATE 40 MG/ML IJ SUSP
40.0000 mg | Freq: Once | INTRAMUSCULAR | Status: AC
  Administered 2023-10-15: 40 mg via INTRA_ARTICULAR

## 2023-10-15 MED ORDER — HYDROCODONE-ACETAMINOPHEN 5-325 MG PO TABS: ORAL_TABLET | ORAL | 0 refills | Status: DC

## 2023-10-15 NOTE — Addendum Note (Signed)
Addended by: Michaele Offer on: 10/15/2023 09:12 AM   Modules accepted: Orders

## 2023-10-15 NOTE — Telephone Encounter (Signed)
Called patient her voice mail box is full  could not leave a message

## 2023-10-15 NOTE — Telephone Encounter (Signed)
Dr. Sanjuan Dame pt - pt lvm stating she was here this morning and Dr. Hilda Lias sent a script in for her and [facility] is having an issue w/the meds.  She wants to know how long it takes to resolve this.  [phone]

## 2023-10-15 NOTE — Progress Notes (Signed)
Her left deQuervain s has returned.    Procedure note: After permission from the patient, the first extensor compartment of the left wrist was prepped.  I injected 1 cc DepoMedrol 40 and 2 cc plain 1 % xylocaine into the tendon sheath area by sterile technique tolerated well.  Her MRI has been delayed.  Encounter Diagnoses  Name Primary?   De Quervain's tenosynovitis, left Yes   Left hip pain    Chronic left-sided low back pain with left-sided sciatica    Return in two weeks.  Go over MRI.  Call if any problem.  Precautions discussed.  Electronically Signed Darreld Mclean, MD 2/19/20258:24 AM

## 2023-10-16 ENCOUNTER — Ambulatory Visit: Payer: Medicaid Other | Admitting: Family Medicine

## 2023-10-16 ENCOUNTER — Encounter (HOSPITAL_COMMUNITY): Payer: Medicaid Other

## 2023-10-16 ENCOUNTER — Telehealth: Payer: Self-pay | Admitting: Orthopaedic Surgery

## 2023-10-16 NOTE — Telephone Encounter (Signed)
Spoke to Anita Berry explained I tried to call her yesterday and she wants me to make her a priority to get her meds filled  I explained I am the only nurse in the building today and I would do my best

## 2023-10-16 NOTE — Telephone Encounter (Signed)
Dr. Sanjuan Dame pt - Victorino Dike w/[employer] [phone] lvm regarding prior auth for this pt's medication.

## 2023-10-17 ENCOUNTER — Encounter (HOSPITAL_COMMUNITY): Payer: Self-pay

## 2023-10-17 ENCOUNTER — Ambulatory Visit (HOSPITAL_COMMUNITY)
Admission: RE | Admit: 2023-10-17 | Discharge: 2023-10-17 | Disposition: A | Discharge status: Home / Self Care | Payer: Medicaid Other | Source: Ambulatory Visit | Attending: Orthopaedic Surgery | Admitting: Orthopaedic Surgery

## 2023-10-17 DIAGNOSIS — M25552 Pain in left hip: Secondary | ICD-10-CM

## 2023-10-17 DIAGNOSIS — M160 Bilateral primary osteoarthritis of hip: Secondary | ICD-10-CM | POA: Insufficient documentation

## 2023-10-17 MED ORDER — LIDOCAINE HCL (PF) 1 % IJ SOLN
5.0000 mL | Freq: Once | INTRAMUSCULAR | Status: AC
  Administered 2023-10-17: 5 mL

## 2023-10-17 MED ORDER — LIDOCAINE HCL (PF) 1 % IJ SOLN
INTRAMUSCULAR | Status: AC
  Filled 2023-10-17: qty 5

## 2023-10-17 MED ORDER — GADOBUTROL 1 MMOL/ML IV SOLN
2.0000 mL | Freq: Once | INTRAVENOUS | Status: AC | PRN
  Administered 2023-10-17: 0.05 mL

## 2023-10-17 MED ORDER — IOHEXOL 180 MG/ML  SOLN
10.0000 mL | Freq: Once | INTRAMUSCULAR | Status: AC | PRN
Start: 2023-10-17 — End: 2023-10-17
  Administered 2023-10-17: 10 mL

## 2023-10-17 MED ORDER — SODIUM CHLORIDE (PF) 0.9% IJ SOLUTION - NO CHARGE
10.0000 mL | Freq: Once | INTRAMUSCULAR | Status: AC
  Administered 2023-10-17: 10 mL via INTRAVENOUS
  Filled 2023-10-17: qty 10

## 2023-10-17 NOTE — Telephone Encounter (Signed)
Called [employer] back today and spoke to [place] S  patient has been apprpved for her medication until the end of the year the Ref# is [id number]

## 2023-10-29 ENCOUNTER — Ambulatory Visit: Payer: Medicaid Other | Admitting: Orthopaedic Surgery

## 2023-10-29 ENCOUNTER — Encounter: Payer: Self-pay | Admitting: Orthopaedic Surgery

## 2023-10-29 VITALS — BP 123/74 | HR 65

## 2023-10-29 DIAGNOSIS — M1612 Unilateral primary osteoarthritis, left hip: Secondary | ICD-10-CM

## 2023-10-29 MED ORDER — HYDROCODONE-ACETAMINOPHEN 5-325 MG PO TABS: ORAL_TABLET | ORAL | 0 refills | Status: DC

## 2023-10-29 NOTE — Patient Instructions (Addendum)
 We are referring you to [facility] from [facility] address is [address] [place] The phone number is [phone]  The office will call you with an appointment Dr. Magnus Ivan    Put warm compress on you scalp where you have the laceration, if you do not feel better with it discuss with your primary care

## 2023-10-29 NOTE — Progress Notes (Signed)
 My hip hurts.  She had MRI of the left hip.  MRI showed: IMPRESSION: 1. Severe osteoarthritis of the left hip. Severe left anterior labral degeneration extending into the superior labrum. 2. Mild osteoarthritis of the right hip.    I have explained the findings to her.  I will have Dr. Magnus Ivan to see her about possible total hip.  I have independently reviewed the MRI.    Left hip has decreased ROM and pain, limp to the left, NV intact.  Encounter Diagnosis  Name Primary?   Unilateral primary osteoarthritis, left hip Yes   To see Dr. Magnus Ivan.  Call if any problem.  Precautions discussed.  Electronically Signed Darreld Mclean, MD 3/5/20252:14 PM

## 2023-11-05 ENCOUNTER — Telehealth: Payer: Self-pay | Admitting: Orthopaedic Surgery

## 2023-11-05 MED ORDER — HYDROCODONE-ACETAMINOPHEN 5-325 MG PO TABS: ORAL_TABLET | ORAL | 0 refills | Status: DC

## 2023-11-05 NOTE — Telephone Encounter (Signed)
 Dr. Sanjuan Dame pt - pt lvm requesting a refill for Hydrocodone 5-325 to be sent to [facility] on [address]  She no longer uses [facility].

## 2023-11-09 ENCOUNTER — Other Ambulatory Visit: Payer: Self-pay | Admitting: Family Medicine

## 2023-11-11 ENCOUNTER — Other Ambulatory Visit: Payer: Self-pay | Admitting: Family Medicine

## 2023-11-11 DIAGNOSIS — F3131 Bipolar disorder, current episode depressed, mild: Secondary | ICD-10-CM

## 2023-11-11 NOTE — Addendum Note (Signed)
 Encounter addended by: Neldon Newport, RT on: 11/11/2023 9:55 AM  Actions taken: Imaging Exam ended

## 2023-11-24 ENCOUNTER — Encounter: Payer: Self-pay | Admitting: Orthopaedic Surgery

## 2023-11-24 ENCOUNTER — Ambulatory Visit (INDEPENDENT_AMBULATORY_CARE_PROVIDER_SITE_OTHER): Admitting: Orthopaedic Surgery

## 2023-11-24 DIAGNOSIS — M1612 Unilateral primary osteoarthritis, left hip: Secondary | ICD-10-CM | POA: Insufficient documentation

## 2023-11-24 DIAGNOSIS — M25552 Pain in left hip: Secondary | ICD-10-CM

## 2023-11-24 MED ORDER — TRAMADOL HCL 50 MG PO TABS: 50.0000 mg | ORAL_TABLET | Freq: Three times a day (TID) | ORAL | 0 refills | Status: DC | PRN

## 2023-11-24 NOTE — Progress Notes (Signed)
 The patient is a very pleasant and active 63 year old female sent from Dr. Darreld Mclean of [facility] to evaluate and treat known arthritis of her left hip.  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  She is anxious to be able to get back to that work when she considers hip replacement surgery.  She has been dealing with left hip pain for over a year now.  There are x-rays and a MRI of her left hip on the canopy system for me to review.  She does have a friend with her today.  She is not on blood thinning medications.  She is not obese.  She is not on any diabetic medications and is not a diabetic.  I was able to review her past medical history and medications within epic.  She reports daily left hip and groin pain that is detrimentally affecting her mobility, her quality of life and her actives daily living.  She has worked on activity modification and does use a cane or opposite hand to offload that hip.  She has had to take anti-inflammatories but also recently hydrocodone due to the pain in her left hip.  On examination her right hip moves smoothly and fluidly.  The left hip has stiffness with internal and external rotation with significant pain in the groin with rotation.  Plain films on the canopy system as well as the MRI of her left hip shows significant end-stage arthritis of the left hip.  On the MRI there is edematous changes in the femoral head and acetabulum with areas of full-thickness cartilage loss.  There is significant joint space narrowing and irregularities of the femoral head on plain film.  We had a long and thorough discussion about hip replacement surgery.  We discussed the risks and benefits of the surgery and what to expect from an intraoperative and postoperative standpoint.  I gave her hand about hip replacement surgery showed her hip replacement model and went over her imaging studies with her.  All questions and concerns were answered and addressed.   We will work on getting her on the schedule soon for a left total hip arthroplasty.

## 2023-11-24 NOTE — Addendum Note (Signed)
 Addended by: Doneen Poisson on: 11/24/2023 03:47 PM   Modules accepted: Orders

## 2023-11-26 ENCOUNTER — Other Ambulatory Visit: Payer: Self-pay | Admitting: Family Medicine

## 2023-11-26 DIAGNOSIS — G4700 Insomnia, unspecified: Secondary | ICD-10-CM

## 2023-11-28 ENCOUNTER — Telehealth: Payer: Self-pay | Admitting: Pharmacy Technician

## 2023-11-28 ENCOUNTER — Other Ambulatory Visit (HOSPITAL_COMMUNITY): Payer: Self-pay

## 2023-11-28 ENCOUNTER — Encounter: Payer: Self-pay | Admitting: Family Medicine

## 2023-11-28 ENCOUNTER — Ambulatory Visit (INDEPENDENT_AMBULATORY_CARE_PROVIDER_SITE_OTHER): Payer: Medicaid Other | Admitting: Family Medicine

## 2023-11-28 VITALS — BP 110/67 | HR 52 | Resp 16 | Ht 66.0 in | Wt 158.1 lb

## 2023-11-28 DIAGNOSIS — R7301 Impaired fasting glucose: Secondary | ICD-10-CM | POA: Diagnosis not present

## 2023-11-28 DIAGNOSIS — E559 Vitamin D deficiency, unspecified: Secondary | ICD-10-CM

## 2023-11-28 DIAGNOSIS — E038 Other specified hypothyroidism: Secondary | ICD-10-CM

## 2023-11-28 DIAGNOSIS — N3941 Urge incontinence: Secondary | ICD-10-CM

## 2023-11-28 DIAGNOSIS — E7849 Other hyperlipidemia: Secondary | ICD-10-CM

## 2023-11-28 MED ORDER — MIRABEGRON ER 25 MG PO TB24: 25.0000 mg | ORAL_TABLET | Freq: Every day | ORAL | 1 refills | Status: DC

## 2023-11-28 NOTE — Patient Instructions (Addendum)
 I appreciate the opportunity to provide care to you today!    Follow up:  5 months  Labs: please stop by the lab during the week to get your blood drawn (CBC, CMP, TSH, Lipid profile, HgA1c, Vit D)  Urinary Incontinence Start Myrbetriq 25 mg daily as prescribed. Nonpharmacological interventions include: Bladder training (scheduled voiding to increase bladder capacity) Pelvic floor exercises (Kegels) to strengthen the muscles that support bladder control Limiting bladder irritants such as caffeine, alcohol, and acidic foods Maintaining a healthy weight to reduce pressure on the bladder Timed voiding to prevent urgency episodes Fluid management -- avoiding excessive fluid intake close to bedtime     Please continue to a heart-healthy diet and increase your physical activities. Try to exercise for at least five days a week.    It was a pleasure to see you and I look forward to continuing to work together on your health and well-being. Please do not hesitate to call the office if you need care or have questions about your care.  In case of emergency, please visit the Emergency Department for urgent care, or contact our clinic at [phone] to schedule an appointment. We're here to help you!   Have a wonderful day and week. With Gratitude, Gilmore Laroche MSN, FNP-BC

## 2023-11-28 NOTE — Telephone Encounter (Signed)
 Pharmacy Patient Advocate Encounter   Received notification from CoverMyMeds that prior authorization for Mirabegron ER 25MG  er tablets is required/requested.   Insurance verification completed.   The patient is insured through [facility] [place] .   Per test claim:  SEE BELOW is preferred by the insurance.  If suggested medication is appropriate, Please send in a new RX and discontinue this one. If not, please advise as to why it's not appropriate so that we may request a Prior Authorization. Please note, some preferred medications may still require a PA.  If the suggested medications have not been trialed and there are no contraindications to their use, the PA will not be submitted, as it will not be approved.

## 2023-11-28 NOTE — Assessment & Plan Note (Signed)
 Patient complains of involuntary leakage of urine for the past month, without symptoms of urinary tract infection. Episodes occur throughout the day and have negatively impacted her quality of life. Initiating pharmacologic therapy with Myrbetriq 25 mg once daily as prescribed. Nonpharmacological interventions include: Bladder training (scheduled voiding to increase bladder capacity) Pelvic floor exercises (Kegels) to strengthen the muscles supporting bladder control Limiting bladder irritants such as caffeine, alcohol, and acidic foods Maintaining a healthy weight to reduce pressure on the bladder Timed voiding to help prevent urgency episodes Fluid management -- avoiding excessive fluid intake, especially close to bedtime

## 2023-11-28 NOTE — Progress Notes (Signed)
 Established Patient Office Visit  Subjective:  Patient ID: Anita Berry, female    DOB: 1960-10-20  Age: 63 y.o. MRN: [id number]  CC: No chief complaint on file.   HPI Anita Berry is a 63 y.o. female with past medical history of hypothyroidism presents for f/u of  chronic medical conditions.  Past Medical History:  Diagnosis Date   Bipolar 1 disorder (HCC)    Bipolar disorder, unspecified (HCC)    Dementia (HCC)    Thyroid disease     Past Surgical History:  Procedure Laterality Date   CHOLECYSTECTOMY     SHOULDER SURGERY Right    TONSILLECTOMY      No family history on file.  Social History   Socioeconomic History   Marital status: Single    Spouse name: Not on file   Number of children: 0   Years of education: Not on file   Highest education level: Bachelor's degree (e.g., BA, AB, BS)  Occupational History   Occupation: Retired    Comment: Naval architect- consulting  Tobacco Use   Smoking status: Never   Smokeless tobacco: Never  Vaping Use   Vaping status: Never Used  Substance and Sexual Activity   Alcohol use: Not Currently    Comment: none recently   Drug use: Never   Sexual activity: Not Currently  Other Topics Concern   Not on file  Social History Narrative   Never been married; boyfriend when she was 14 committed suicide, and she hasn't had solid relationship since   Social Drivers of Health   Financial Resource Strain: High Risk (11/27/2023)   Overall Financial Resource Strain (CARDIA)    Difficulty of Paying Living Expenses: Very hard  Food Insecurity: No Food Insecurity (11/27/2023)   Hunger Vital Sign    Worried About Running Out of Food in the Last Year: Never true    Ran Out of Food in the Last Year: Never true  Transportation Needs: No Transportation Needs (11/27/2023)   PRAPARE - Administrator, Civil Service (Medical): No    Lack of Transportation (Non-Medical): No  Physical Activity: Sufficiently Active (11/27/2023)    Exercise Vital Sign    Days of Exercise per Week: 7 days    Minutes of Exercise per Session: 140 min  Stress: No Stress Concern Present (11/27/2023)   [employer] of Occupational Health - Occupational Stress Questionnaire    Feeling of Stress : Only a little  Social Connections: Socially Isolated (11/27/2023)   Social Connection and Isolation Panel [NHANES]    Frequency of Communication with Friends and Family: More than three times a week    Frequency of Social Gatherings with Friends and Family: Never    Attends Religious Services: Never    Database administrator or Organizations: No    Attends Engineer, structural: Not on file    Marital Status: Never married  Intimate Partner Violence: Not on file    Outpatient Medications Prior to Visit  Medication Sig Dispense Refill   Calcium Carb-Cholecalciferol (CALTRATE 600+D3) 600-20 MG-MCG TABS Take 600 mg by mouth 2 (two) times daily. 60 tablet 3   Cholecalciferol (VITAMIN D3) 25 MCG (1000 UT) CAPS Take 1 capsule (1,000 Units total) by mouth daily. 60 capsule 3   donepezil (ARICEPT) 5 MG tablet Take 1 tablet by mouth once daily 90 tablet 0   Doxepin HCl 5 % CREA Apply a thin film 4 times/day with at least 3- to 4-hour interval between applications; not  recommended for use >8 days. 30 g 0   levothyroxine (SYNTHROID) 50 MCG tablet Take 1 tablet by mouth once daily 90 tablet 0   meloxicam (MOBIC) 15 MG tablet Take 1 tablet (15 mg total) by mouth daily. 30 tablet 5   omeprazole (PRILOSEC) 20 MG capsule Take 1 capsule by mouth once daily 90 capsule 0   traMADol (ULTRAM) 50 MG tablet Take 1-2 tablets (50-100 mg total) by mouth 3 (three) times daily as needed. 30 tablet 0   traZODone (DESYREL) 100 MG tablet TAKE 1 TABLET BY MOUTH AT BEDTIME 90 tablet 0   venlafaxine (EFFEXOR) 75 MG tablet Take 1 tablet by mouth twice daily 60 tablet 0   HYDROcodone-acetaminophen (NORCO/VICODIN) 5-325 MG tablet One tablet every six hours for pain.  Limit  7 days. (Patient not taking: Reported on 11/28/2023) 28 tablet 0   Vitamin D, Ergocalciferol, (DRISDOL) 1.25 MG (50000 UNIT) CAPS capsule Take 1 capsule (50,000 Units total) by mouth every 7 (seven) days. (Patient not taking: Reported on 11/28/2023) 20 capsule 1   benzonatate (TESSALON) 100 MG capsule Take 1 capsule (100 mg total) by mouth 2 (two) times daily as needed for cough. 20 capsule 0   No facility-administered medications prior to visit.    Allergies  Allergen Reactions   Prednisone Other (See Comments)    Kept her up and sent her into a manic mood    Sulfa Antibiotics Swelling    ROS Review of Systems  Constitutional:  Negative for chills and fever.  Eyes:  Negative for visual disturbance.  Respiratory:  Negative for chest tightness and shortness of breath.   Genitourinary:        Involuntary leakage of urine for 1 month  Neurological:  Negative for dizziness and headaches.      Objective:    Physical Exam HENT:     Head: Normocephalic.     Mouth/Throat:     Mouth: Mucous membranes are moist.  Cardiovascular:     Rate and Rhythm: Normal rate.     Heart sounds: Normal heart sounds.  Pulmonary:     Effort: Pulmonary effort is normal.     Breath sounds: Normal breath sounds.  Neurological:     Mental Status: She is alert.     BP 110/67   Pulse (!) 52   Resp 16   Ht 5\' 6"  (1.676 m)   Wt 158 lb 1.9 oz (71.7 kg)   SpO2 96%   BMI 25.52 kg/m  Wt Readings from Last 3 Encounters:  11/28/23 158 lb 1.9 oz (71.7 kg)  08/29/23 161 lb 1.3 oz (73.1 kg)  05/29/23 159 lb 1.9 oz (72.2 kg)    Lab Results  Component Value Date   TSH 2.290 06/09/2023   Lab Results  Component Value Date   WBC 4.5 06/09/2023   HGB 13.3 06/09/2023   HCT 40.4 06/09/2023   MCV 95 06/09/2023   PLT 225 06/09/2023   Lab Results  Component Value Date   NA 142 06/09/2023   K 4.3 06/09/2023   CO2 20 06/09/2023   GLUCOSE 111 (H) 06/09/2023   BUN 12 06/09/2023   CREATININE 0.94  06/09/2023   BILITOT 0.4 06/09/2023   ALKPHOS 165 (H) 06/09/2023   AST 17 06/09/2023   ALT 11 06/09/2023   PROT 6.4 06/09/2023   ALBUMIN 4.4 06/09/2023   CALCIUM 9.0 06/09/2023   EGFR 68 06/09/2023   Lab Results  Component Value Date   CHOL 222 (H) 06/09/2023  Lab Results  Component Value Date   HDL 129 06/09/2023   Lab Results  Component Value Date   LDLCALC 84 06/09/2023   Lab Results  Component Value Date   TRIG 47 06/09/2023   Lab Results  Component Value Date   CHOLHDL 1.7 06/09/2023   Lab Results  Component Value Date   HGBA1C 5.6 06/09/2023      Assessment & Plan:  Urge incontinence of urine Assessment & Plan: Patient complains of involuntary leakage of urine for the past month, without symptoms of urinary tract infection. Episodes occur throughout the day and have negatively impacted her quality of life. Initiating pharmacologic therapy with Myrbetriq 25 mg once daily as prescribed. Nonpharmacological interventions include: Bladder training (scheduled voiding to increase bladder capacity) Pelvic floor exercises (Kegels) to strengthen the muscles supporting bladder control Limiting bladder irritants such as caffeine, alcohol, and acidic foods Maintaining a healthy weight to reduce pressure on the bladder Timed voiding to help prevent urgency episodes Fluid management -- avoiding excessive fluid intake, especially close to bedtime   Orders: -     Mirabegron ER; Take 1 tablet (25 mg total) by mouth daily.  Dispense: 30 tablet; Refill: 1  Vitamin D deficiency Assessment & Plan: Encouraged to increase his intake of vitamin D-rich foods such as fatty fish (e.g., salmon, mackerel, and sardines), fortified dairy products, egg yolks, and fortified cereals.   Orders: -     VITAMIN D 25 Hydroxy (Vit-D Deficiency, Fractures)  IFG (impaired fasting glucose) -     Hemoglobin A1c  TSH (thyroid-stimulating hormone deficiency) -     TSH + free T4  Other  hyperlipidemia -     Lipid panel -     CMP14+EGFR -     CBC with Differential/Platelet   Note: This chart has been completed using Engineer, civil (consulting) software, and while attempts have been made to ensure accuracy, certain words and phrases may not be transcribed as intended.   Follow-up: Return in about 5 months (around 04/29/2024).   Gilmore Laroche, FNP

## 2023-11-28 NOTE — Assessment & Plan Note (Signed)
 Encouraged to increase his intake of vitamin D-rich foods such as fatty fish (e.g., salmon, mackerel, and sardines), fortified dairy products, egg yolks, and fortified cereals.

## 2023-11-29 ENCOUNTER — Other Ambulatory Visit: Payer: Self-pay | Admitting: Family Medicine

## 2023-11-29 ENCOUNTER — Encounter: Payer: Self-pay | Admitting: Family Medicine

## 2023-11-29 DIAGNOSIS — E559 Vitamin D deficiency, unspecified: Secondary | ICD-10-CM

## 2023-11-29 LAB — CBC WITH DIFFERENTIAL/PLATELET
Basophils Absolute: 0 10*3/uL (ref 0.0–0.2)
Basos: 0 %
EOS (ABSOLUTE): 0.1 10*3/uL (ref 0.0–0.4)
Eos: 1 %
Hematocrit: 38.6 % (ref 34.0–46.6)
Hemoglobin: 13.1 g/dL (ref 11.1–15.9)
Immature Grans (Abs): 0 10*3/uL (ref 0.0–0.1)
Immature Granulocytes: 0 %
Lymphocytes Absolute: 1.2 10*3/uL (ref 0.7–3.1)
Lymphs: 30 %
MCH: 31.9 pg (ref 26.6–33.0)
MCHC: 33.9 g/dL (ref 31.5–35.7)
MCV: 94 fL (ref 79–97)
Monocytes Absolute: 0.3 10*3/uL (ref 0.1–0.9)
Monocytes: 7 %
Neutrophils Absolute: 2.4 10*3/uL (ref 1.4–7.0)
Neutrophils: 62 %
Platelets: 216 10*3/uL (ref 150–450)
RBC: 4.11 x10E6/uL (ref 3.77–5.28)
RDW: 12.7 % (ref 11.7–15.4)
WBC: 3.9 10*3/uL (ref 3.4–10.8)

## 2023-11-29 LAB — HEMOGLOBIN A1C
Est. average glucose Bld gHb Est-mCnc: 105 mg/dL
Hgb A1c MFr Bld: 5.3 % (ref 4.8–5.6)

## 2023-11-29 LAB — CMP14+EGFR
ALT: 9 IU/L (ref 0–32)
AST: 20 IU/L (ref 0–40)
Albumin: 4.4 g/dL (ref 3.9–4.9)
Alkaline Phosphatase: 139 IU/L — ABNORMAL HIGH (ref 44–121)
BUN/Creatinine Ratio: 17 (ref 12–28)
BUN: 17 mg/dL (ref 8–27)
Bilirubin Total: 0.3 mg/dL (ref 0.0–1.2)
CO2: 21 mmol/L (ref 20–29)
Calcium: 9 mg/dL (ref 8.7–10.3)
Chloride: 104 mmol/L (ref 96–106)
Creatinine, Ser: 0.98 mg/dL (ref 0.57–1.00)
Globulin, Total: 1.9 g/dL (ref 1.5–4.5)
Glucose: 90 mg/dL (ref 70–99)
Potassium: 4.3 mmol/L (ref 3.5–5.2)
Sodium: 145 mmol/L — ABNORMAL HIGH (ref 134–144)
Total Protein: 6.3 g/dL (ref 6.0–8.5)
eGFR: 64 mL/min/{1.73_m2} (ref >59)

## 2023-11-29 LAB — TSH+FREE T4
Free T4: 1.4 ng/dL (ref 0.82–1.77)
TSH: 2.34 u[IU]/mL (ref 0.450–4.500)

## 2023-11-29 LAB — LIPID PANEL
Chol/HDL Ratio: 2.1 ratio (ref 0.0–4.4)
Cholesterol, Total: 187 mg/dL (ref 100–199)
HDL: 87 mg/dL (ref >39)
LDL Chol Calc (NIH): 88 mg/dL (ref 0–99)
Triglycerides: 61 mg/dL (ref 0–149)
VLDL Cholesterol Cal: 12 mg/dL (ref 5–40)

## 2023-11-29 LAB — VITAMIN D 25 HYDROXY (VIT D DEFICIENCY, FRACTURES): Vit D, 25-Hydroxy: 22.9 ng/mL — ABNORMAL LOW (ref 30.0–100.0)

## 2023-11-29 MED ORDER — VITAMIN D (ERGOCALCIFEROL) 1.25 MG (50000 UNIT) PO CAPS: 50000.0000 [IU] | ORAL_CAPSULE | ORAL | 1 refills | Status: DC

## 2023-12-01 ENCOUNTER — Other Ambulatory Visit: Payer: Self-pay | Admitting: Orthopaedic Surgery

## 2023-12-01 ENCOUNTER — Telehealth: Payer: Self-pay | Admitting: Orthopaedic Surgery

## 2023-12-01 ENCOUNTER — Other Ambulatory Visit: Payer: Self-pay | Admitting: Family Medicine

## 2023-12-01 DIAGNOSIS — N3941 Urge incontinence: Secondary | ICD-10-CM

## 2023-12-01 MED ORDER — OXYBUTYNIN CHLORIDE ER 5 MG PO TB24: 5.0000 mg | ORAL_TABLET | Freq: Every day | ORAL | 1 refills | Status: DC

## 2023-12-01 MED ORDER — TRAMADOL HCL 50 MG PO TABS: 50.0000 mg | ORAL_TABLET | Freq: Three times a day (TID) | ORAL | 0 refills | Status: DC | PRN

## 2023-12-01 NOTE — Telephone Encounter (Signed)
 Pt called requesting a refill of tramadol. Please send to [facility] [place]. Pt phone number is [phone].

## 2023-12-08 ENCOUNTER — Other Ambulatory Visit: Payer: Self-pay | Admitting: Orthopaedic Surgery

## 2023-12-10 NOTE — Telephone Encounter (Signed)
 Can we have update on possible therapy change?

## 2023-12-11 ENCOUNTER — Other Ambulatory Visit: Payer: Self-pay | Admitting: Family Medicine

## 2023-12-11 NOTE — Telephone Encounter (Signed)
 A prescription for oxybutynin 5 mg at bedtime was sent to the pharmacy on December 01, 2023.

## 2023-12-15 ENCOUNTER — Other Ambulatory Visit: Payer: Self-pay | Admitting: Orthopaedic Surgery

## 2023-12-16 ENCOUNTER — Telehealth: Payer: Self-pay | Admitting: Orthopaedic Surgery

## 2023-12-16 NOTE — Telephone Encounter (Signed)
 Patient aware.

## 2023-12-16 NOTE — Telephone Encounter (Signed)
 Patient called and said that her last prescription was for her to take 1-2 pills for every 4-6 hours and the new prescription say 1 every 12 hours. And now the insurance say that they can't fill it. Her pharmacy said that you could send a emergency RX. CB#[phone]

## 2023-12-18 NOTE — Progress Notes (Signed)
 COVID Vaccine received:  []  No [x]  Yes Date of any COVID positive Test in last 90 days: no PCP - Gloria Zarwolo NP Cardiologist - Vishnu Mallipeddi MD  Chest x-ray -  EKG -   Stress Test -  ECHO - 11/19/22 Epic Cardiac Cath -   Bowel Prep - [x]  No  []   Yes ______  Pacemaker / ICD device [x]  No []  Yes   Spinal Cord Stimulator:[x]  No []  Yes       History of Sleep Apnea? [x]  No []  Yes   CPAP used?- [x]  No []  Yes    Does the patient monitor blood sugar?          [x]  No []  Yes  []  N/A  Patient has: [x]  NO Hx DM   []  Pre-DM                 []  DM1  []   DM2 Does patient have a [employer] or Dexacom? []  No []  Yes   Fasting Blood Sugar Ranges-  Checks Blood Sugar _____ times a day  GLP1 agonist / usual dose - no GLP1 instructions:  SGLT-2 inhibitors / usual dose - no SGLT-2 instructions:   Blood Thinner / Instructions:no Aspirin Instructions:no  Comments:   Activity level: Patient is able  to climb a flight of stairs without difficulty; [x]  No CP  [x]  No SOB,_   Patient can perform ADLs without assistance.   Anesthesia review:   Patient denies shortness of breath, fever, cough and chest pain at PAT appointment.  Patient verbalized understanding and agreement to the Pre-Surgical Instructions that were given to them at this PAT appointment. Patient was also educated of the need to review these PAT instructions again prior to his/her surgery.I reviewed the appropriate phone numbers to call if they have any and questions or concerns.

## 2023-12-18 NOTE — Patient Instructions (Signed)
 SURGICAL WAITING ROOM VISITATION  Patients having surgery or a procedure may have no more than 2 support people in the waiting area - these visitors may rotate.    Children under the age of 71 must have an adult with them who is not the patient.  Due to an increase in RSV and influenza rates and associated hospitalizations, children ages 30 and under may not visit patients in [facility] hospitals.  Visitors with respiratory illnesses are discouraged from visiting and should remain at home.  If the patient needs to stay at the hospital during part of their recovery, the visitor guidelines for inpatient rooms apply. Pre-op nurse will coordinate an appropriate time for 1 support person to accompany patient in pre-op.  This support person may not rotate.    Please refer to the [facility] website for the visitor guidelines for Inpatients (after your surgery is over and you are in a regular room).       Your procedure is scheduled on: 12/26/23   Report to [facility] Main Entrance    Report to admitting at 9 AM   Call this number if you have problems the morning of surgery [phone]   Do not eat food :After Midnight.   After Midnight you may have the following liquids until 8:30 AM DAY OF SURGERY  Water  Non-Citrus Juices (without pulp, NO RED-Apple, White grape, White cranberry) Black Coffee (NO MILK/CREAM OR CREAMERS, sugar ok)  Clear Tea (NO MILK/CREAM OR CREAMERS, sugar ok) regular and decaf                             Plain Jell-O (NO RED)                                           Fruit ices (not with fruit pulp, NO RED)                                     Popsicles (NO RED)                                                               Sports drinks like Gatorade (NO RED)                  The day of surgery:  Drink ONE (1) Pre-Surgery Clear Ensure at 8:30 AM the morning of surgery. Drink in one sitting. Do not sip.  This drink was given to you during your hospital   pre-op appointment visit. Nothing else to drink after completing the  Pre-Surgery Clear Ensure .          If you have questions, please contact your surgeon's office.     Oral Hygiene is also important to reduce your risk of infection.                                    Remember - BRUSH YOUR TEETH THE MORNING OF SURGERY WITH YOUR REGULAR TOOTHPASTE  DENTURES WILL BE REMOVED PRIOR TO SURGERY PLEASE DO NOT APPLY "Poly grip" OR ADHESIVES!!!   Stop all vitamins and herbal supplements 7 days before surgery.   Take these medicines the morning of surgery with A SIP OF WATER: aricept (donepezil ), levothyroxine (synthroid ), omeprazole (prilosec ), venlafaxine (effexor )             You may not have any metal on your body including hair pins, jewelry, and body piercing             Do not wear make-up, lotions, powders, perfumes/cologne, or deodorant  Do not wear nail polish including gel and S&S, artificial/acrylic nails, or any other type of covering on natural nails including finger and toenails. If you have artificial nails, gel coating, etc. that needs to be removed by a nail salon please have this removed prior to surgery or surgery may need to be canceled/ delayed if the surgeon/ anesthesia feels like they are unable to be safely monitored.   Do not shave  48 hours prior to surgery.    Do not bring valuables to the hospital. [facility] IS NOT             RESPONSIBLE   FOR VALUABLES.   Contacts, glasses, dentures or bridgework may not be worn into surgery.   Bring small overnight bag day of surgery.   DO NOT BRING YOUR HOME MEDICATIONS TO THE HOSPITAL. PHARMACY WILL DISPENSE MEDICATIONS LISTED ON YOUR MEDICATION LIST TO YOU DURING YOUR ADMISSION IN THE HOSPITAL!    Patients discharged on the day of surgery will not be allowed to drive home.  Someone NEEDS to stay with you for the first 24 hours after anesthesia.   Special Instructions: Bring a copy of your healthcare power of attorney  and living will documents the day of surgery if you haven't scanned them before.              Please read over the following fact sheets you were given: IF YOU HAVE QUESTIONS ABOUT YOUR PRE-OP INSTRUCTIONS PLEASE CALL [phone] Ammon Bales   If you received a COVID test during your pre-op visit  it is requested that you wear a mask when out in public, stay away from anyone that may not be feeling well and notify your surgeon if you develop symptoms. If you test positive for Covid or have been in contact with anyone that has tested positive in the last 10 days please notify you surgeon.      Pre-operative 5 CHG Bath Instructions   You can play a key role in reducing the risk of infection after surgery. Your skin needs to be as free of germs as possible. You can reduce the number of germs on your skin by washing with CHG (chlorhexidine gluconate) soap before surgery. CHG is an antiseptic soap that kills germs and continues to kill germs even after washing.   DO NOT use if you have an allergy to chlorhexidine/CHG or antibacterial soaps. If your skin becomes reddened or irritated, stop using the CHG and notify one of our RNs at [phone].   Please shower with the CHG soap starting 4 days before surgery using the following schedule:     Please keep in mind the following:  DO NOT shave, including legs and underarms, starting the day of your first shower.   You may shave your face at any point before/day of surgery.  Place clean sheets on your bed the day you start using CHG soap. Use a clean washcloth (not used  since being washed) for each shower. DO NOT sleep with pets once you start using the CHG.   CHG Shower Instructions:  If you choose to wash your hair and private area, wash first with your normal shampoo/soap.  After you use shampoo/soap, rinse your hair and body thoroughly to remove shampoo/soap residue.  Turn the water OFF and apply about 3 tablespoons (45 ml) of CHG soap to a CLEAN  washcloth.  Apply CHG soap ONLY FROM YOUR NECK DOWN TO YOUR TOES (washing for 3-5 minutes)  DO NOT use CHG soap on face, private areas, open wounds, or sores.  Pay special attention to the area where your surgery is being performed.  If you are having back surgery, having someone wash your back for you may be helpful. Wait 2 minutes after CHG soap is applied, then you may rinse off the CHG soap.  Pat dry with a clean towel  Put on clean clothes/pajamas   If you choose to wear lotion, please use ONLY the CHG-compatible lotions on the back of this paper.     Additional instructions for the day of surgery: DO NOT APPLY any lotions, deodorants, cologne, or perfumes.   Put on clean/comfortable clothes.  Brush your teeth.  Ask your nurse before applying any prescription medications to the skin.      CHG Compatible Lotions   Aveeno Moisturizing lotion  Cetaphil Moisturizing Cream  Cetaphil Moisturizing Lotion  Clairol Herbal Essence Moisturizing Lotion, Dry Skin  Clairol Herbal Essence Moisturizing Lotion, Extra Dry Skin  Clairol Herbal Essence Moisturizing Lotion, Normal Skin  Curel Age Defying Therapeutic Moisturizing Lotion with Alpha Hydroxy  Curel Extreme Care Body Lotion  Curel Soothing Hands Moisturizing Hand Lotion  Curel Therapeutic Moisturizing Cream, Fragrance-Free  Curel Therapeutic Moisturizing Lotion, Fragrance-Free  Curel Therapeutic Moisturizing Lotion, Original Formula  Eucerin Daily Replenishing Lotion  Eucerin Dry Skin Therapy Plus Alpha Hydroxy Crme  Eucerin Dry Skin Therapy Plus Alpha Hydroxy Lotion  Eucerin Original Crme  Eucerin Original Lotion  Eucerin Plus Crme Eucerin Plus Lotion  Eucerin TriLipid Replenishing Lotion  Keri Anti-Bacterial Hand Lotion  Keri Deep Conditioning Original Lotion Dry Skin Formula Softly Scented  Keri Deep Conditioning Original Lotion, Fragrance Free Sensitive Skin Formula  Keri Lotion Fast Absorbing Fragrance Free Sensitive  Skin Formula  Keri Lotion Fast Absorbing Softly Scented Dry Skin Formula  Keri Original Lotion  Keri Skin Renewal Lotion Keri Silky Smooth Lotion  Keri Silky Smooth Sensitive Skin Lotion  Nivea Body Creamy Conditioning Oil  Nivea Body Extra Enriched Lotion  Nivea Body Original Lotion  Nivea Body Sheer Moisturizing Lotion Nivea Crme  Nivea Skin Firming Lotion  NutraDerm 30 Skin Lotion  NutraDerm Skin Lotion  NutraDerm Therapeutic Skin Cream  NutraDerm Therapeutic Skin Lotion  ProShield Protective Hand Cream   Incentive Spirometer  An incentive spirometer is a tool that can help keep your lungs clear and active. This tool measures how well you are filling your lungs with each breath. Taking long deep breaths may help reverse or decrease the chance of developing breathing (pulmonary) problems (especially infection) following: A long period of time when you are unable to move or be active. BEFORE THE PROCEDURE  If the spirometer includes an indicator to show your best effort, your nurse or respiratory therapist will set it to a desired goal. If possible, sit up straight or lean slightly forward. Try not to slouch. Hold the incentive spirometer in an upright position. INSTRUCTIONS FOR USE  Sit on the edge of  your bed if possible, or sit up as far as you can in bed or on a chair. Hold the incentive spirometer in an upright position. Breathe out normally. Place the mouthpiece in your mouth and seal your lips tightly around it. Breathe in slowly and as deeply as possible, raising the piston or the ball toward the top of the column. Hold your breath for 3-5 seconds or for as long as possible. Allow the piston or ball to fall to the bottom of the column. Remove the mouthpiece from your mouth and breathe out normally. Rest for a few seconds and repeat Steps 1 through 7 at least 10 times every 1-2 hours when you are awake. Take your time and take a few normal breaths between deep breaths. The  spirometer may include an indicator to show your best effort. Use the indicator as a goal to work toward during each repetition. After each set of 10 deep breaths, practice coughing to be sure your lungs are clear. If you have an incision (the cut made at the time of surgery), support your incision when coughing by placing a pillow or rolled up towels firmly against it. Once you are able to get out of bed, walk around indoors and cough well. You may stop using the incentive spirometer when instructed by your caregiver.  RISKS AND COMPLICATIONS Take your time so you do not get dizzy or light-headed. If you are in pain, you may need to take or ask for pain medication before doing incentive spirometry. It is harder to take a deep breath if you are having pain. AFTER USE Rest and breathe slowly and easily. It can be helpful to keep track of a log of your progress. Your caregiver can provide you with a simple table to help with this. If you are using the spirometer at home, follow these instructions: SEEK MEDICAL CARE IF:  You are having difficultly using the spirometer. You have trouble using the spirometer as often as instructed. Your pain medication is not giving enough relief while using the spirometer. You develop fever of 100.5 F (38.1 C) or higher. SEEK IMMEDIATE MEDICAL CARE IF:  You cough up bloody sputum that had not been present before. You develop fever of 102 F (38.9 C) or greater. You develop worsening pain at or near the incision site. MAKE SURE YOU:  Understand these instructions. Will watch your condition. Will get help right away if you are not doing well or get worse.  WHAT IS A BLOOD TRANSFUSION? Blood Transfusion Information  A transfusion is the replacement of blood or some of its parts. Blood is made up of multiple cells which provide different functions. Red blood cells carry oxygen and are used for blood loss replacement. White blood cells fight against  infection. Platelets control bleeding. Plasma helps clot blood. Other blood products are available for specialized needs, such as hemophilia or other clotting disorders. BEFORE THE TRANSFUSION  Who gives blood for transfusions?  Healthy volunteers who are fully evaluated to make sure their blood is safe. This is blood bank blood. Transfusion therapy is the safest it has ever been in the practice of medicine. Before blood is taken from a donor, a complete history is taken to make sure that person has no history of diseases nor engages in risky social behavior (examples are intravenous drug use or sexual activity with multiple partners). The donor's travel history is screened to minimize risk of transmitting infections, such as malaria. The donated blood is tested  for signs of infectious diseases, such as HIV and hepatitis. The blood is then tested to be sure it is compatible with you in order to minimize the chance of a transfusion reaction. If you or a relative donates blood, this is often done in anticipation of surgery and is not appropriate for emergency situations. It takes many days to process the donated blood. RISKS AND COMPLICATIONS Although transfusion therapy is very safe and saves many lives, the main dangers of transfusion include:  Getting an infectious disease. Developing a transfusion reaction. This is an allergic reaction to something in the blood you were given. Every precaution is taken to prevent this. The decision to have a blood transfusion has been considered carefully by your caregiver before blood is given. Blood is not given unless the benefits outweigh the risks. AFTER THE TRANSFUSION Right after receiving a blood transfusion, you will usually feel much better and more energetic. This is especially true if your red blood cells have gotten low (anemic). The transfusion raises the level of the red blood cells which carry oxygen, and this usually causes an energy increase. The  nurse administering the transfusion will monitor you carefully for complications. HOME CARE INSTRUCTIONS  No special instructions are needed after a transfusion. You may find your energy is better. Speak with your caregiver about any limitations on activity for underlying diseases you may have. SEEK MEDICAL CARE IF:  Your condition is not improving after your transfusion. You develop redness or irritation at the intravenous (IV) site. SEEK IMMEDIATE MEDICAL CARE IF:  Any of the following symptoms occur over the next 12 hours: Shaking chills. You have a temperature by mouth above 102 F (38.9 C), not controlled by medicine. Chest, back, or muscle pain. People around you feel you are not acting correctly or are confused. Shortness of breath or difficulty breathing. Dizziness and fainting. You get a rash or develop hives. You have a decrease in urine output. Your urine turns a dark color or changes to pink, red, or brown. Any of the following symptoms occur over the next 10 days: You have a temperature by mouth above 102 F (38.9 C), not controlled by medicine. Shortness of breath. Weakness after normal activity. The white part of the eye turns yellow (jaundice). You have a decrease in the amount of urine or are urinating less often. Your urine turns a dark color or changes to pink, red, or brown. Document Released: 08/09/2000 Document Revised: 11/04/2011 Document Reviewed: 03/28/2008 [facility] Patient Information 2014 [place], [place].

## 2023-12-19 ENCOUNTER — Encounter (HOSPITAL_COMMUNITY): Payer: Self-pay

## 2023-12-19 ENCOUNTER — Other Ambulatory Visit: Payer: Self-pay

## 2023-12-19 ENCOUNTER — Encounter (HOSPITAL_COMMUNITY)
Admission: RE | Admit: 2023-12-19 | Discharge: 2023-12-19 | Disposition: A | Discharge status: Home / Self Care | Source: Ambulatory Visit | Attending: Orthopaedic Surgery | Admitting: Orthopaedic Surgery

## 2023-12-19 VITALS — BP 123/75 | HR 56 | Temp 98.2°F | Resp 16 | Ht 66.0 in | Wt 150.0 lb

## 2023-12-19 DIAGNOSIS — Z01818 Encounter for other preprocedural examination: Secondary | ICD-10-CM

## 2023-12-19 DIAGNOSIS — M1612 Unilateral primary osteoarthritis, left hip: Secondary | ICD-10-CM | POA: Diagnosis not present

## 2023-12-19 HISTORY — DX: Unspecified osteoarthritis, unspecified site: M19.90

## 2023-12-19 HISTORY — DX: Depression, unspecified: F32.A

## 2023-12-19 HISTORY — DX: Hypothyroidism, unspecified: E03.9

## 2023-12-19 LAB — CBC
HCT: 42 % (ref 36.0–46.0)
Hemoglobin: 13.4 g/dL (ref 12.0–15.0)
MCH: 31.5 pg (ref 26.0–34.0)
MCHC: 31.9 g/dL (ref 30.0–36.0)
MCV: 98.6 fL (ref 80.0–100.0)
Platelets: 190 10*3/uL (ref 150–400)
RBC: 4.26 MIL/uL (ref 3.87–5.11)
RDW: 12.9 % (ref 11.5–15.5)
WBC: 4.7 10*3/uL (ref 4.0–10.5)
nRBC: 0 % (ref 0.0–0.2)

## 2023-12-19 LAB — BASIC METABOLIC PANEL WITH GFR
Anion gap: 14 (ref 5–15)
BUN: 18 mg/dL (ref 8–23)
CO2: 22 mmol/L (ref 22–32)
Calcium: 9 mg/dL (ref 8.9–10.3)
Chloride: 114 mmol/L — ABNORMAL HIGH (ref 98–111)
Creatinine, Ser: 0.96 mg/dL (ref 0.44–1.00)
GFR, Estimated: >60 mL/min (ref >60)
Glucose, Bld: 124 mg/dL — ABNORMAL HIGH (ref 70–99)
Potassium: 3.7 mmol/L (ref 3.5–5.1)
Sodium: 150 mmol/L — ABNORMAL HIGH (ref 135–145)

## 2023-12-19 LAB — SURGICAL PCR SCREEN
MRSA, PCR: NEGATIVE
Staphylococcus aureus: POSITIVE — AB

## 2023-12-19 NOTE — Progress Notes (Signed)
 Routed request to Dr. Lucienne Ryder to review pt's preop PCR result.

## 2023-12-22 ENCOUNTER — Telehealth: Payer: Self-pay | Admitting: Orthopaedic Surgery

## 2023-12-22 NOTE — Telephone Encounter (Signed)
 Pt request a call regarding her pre op lab work. Pt states she received a MyChart message that the labs was abnormal and she's concerned that her surgery will be cancelled

## 2023-12-22 NOTE — Telephone Encounter (Signed)
Patient aware of the below message from [facility]  

## 2023-12-23 ENCOUNTER — Other Ambulatory Visit: Payer: Self-pay | Admitting: Family Medicine

## 2023-12-23 DIAGNOSIS — F3131 Bipolar disorder, current episode depressed, mild: Secondary | ICD-10-CM

## 2023-12-23 NOTE — Telephone Encounter (Unsigned)
 Copied from CRM [id number]. Topic: Clinical - Medication Refill >> Dec 23, 2023  9:15 AM Lisa Rideau B wrote: Most Recent Primary Care Visit:  Provider: Junnie Olives  Department: [facility]  Visit Type: OFFICE VISIT  Date: 11/28/2023  Medication: donepezil  (ARICEPT ) 5 MG tablet [[id number]]/venlafaxine  (EFFEXOR ) 75 MG tablet  Has the patient contacted their pharmacy? yes (Agent: If yes, when and what did the pharmacy advise?)contact pcp  Is this the correct pharmacy for this prescription? yes   This is the patient's preferred pharmacy:  [facility] [phone] - [place], [place] - [address] AT [facility] [address] [place] [address] Phone: [phone] Fax: [phone]   Has the prescription been filled recently? no  Is the patient out of the medication? yes  Has the patient been seen for an appointment in the last year OR does the patient have an upcoming appointment? yes  Can we respond through MyChart? yes  Agent: Please be advised that Rx refills may take up to 3 business days. We ask that you follow-up with your pharmacy.

## 2023-12-24 ENCOUNTER — Other Ambulatory Visit: Payer: Self-pay

## 2023-12-24 ENCOUNTER — Telehealth: Payer: Self-pay

## 2023-12-24 DIAGNOSIS — F3131 Bipolar disorder, current episode depressed, mild: Secondary | ICD-10-CM

## 2023-12-24 MED ORDER — VENLAFAXINE HCL 75 MG PO TABS: 75.0000 mg | ORAL_TABLET | Freq: Two times a day (BID) | ORAL | 0 refills | Status: DC

## 2023-12-24 MED ORDER — VENLAFAXINE HCL 75 MG PO TABS: 75.0000 mg | ORAL_TABLET | Freq: Two times a day (BID) | ORAL | 5 refills | Status: DC

## 2023-12-24 MED ORDER — DONEPEZIL HCL 5 MG PO TABS: 5.0000 mg | ORAL_TABLET | Freq: Every day | ORAL | 0 refills | Status: DC

## 2023-12-24 MED ORDER — DONEPEZIL HCL 5 MG PO TABS: 5.0000 mg | ORAL_TABLET | Freq: Every day | ORAL | 1 refills | Status: DC

## 2023-12-24 NOTE — Telephone Encounter (Signed)
 Copied from CRM [id number]. Topic: General - Other >> Dec 24, 2023  8:34 AM Essie A wrote: Reason for CRM: Patient is calling to get refill on maintenance medication.  She can't wait for the refills t be put in.  Will be coming to the office to speak with someone.

## 2023-12-24 NOTE — Telephone Encounter (Signed)
 Refill for requested aricept  and effexor  sent to her pharmacy

## 2023-12-25 NOTE — H&P (Signed)
 TOTAL HIP ADMISSION H&P  Patient is admitted for left total hip arthroplasty.  Subjective:  Chief Complaint: left hip pain  HPI: Anita Berry, 63 y.o. female, has a history of pain and functional disability in the left hip(s) due to arthritis and patient has failed non-surgical conservative treatments for greater than 12 weeks to include NSAID's and/or analgesics, use of assistive devices, and activity modification.  Onset of symptoms was gradual starting 2 years ago with gradually worsening course since that time.The patient noted no past surgery on the left hip(s).  Patient currently rates pain in the left hip at 10 out of 10 with activity. Patient has night pain, worsening of pain with activity and weight bearing, trendelenberg gait, pain that interfers with activities of daily living, and pain with passive range of motion. Patient has evidence of subchondral sclerosis, periarticular osteophytes, and joint space narrowing by imaging studies. This condition presents safety issues increasing the risk of falls.  There is no current active infection.  Patient Active Problem List   Diagnosis Date Noted   Urge incontinence of urine 11/28/2023   Unilateral primary osteoarthritis, left hip 11/24/2023   Neuropathic pain of thigh, left 08/31/2023   Encounter for screening mammogram for malignant neoplasm of breast 08/31/2023   Chronic left SI joint pain 08/29/2023   Acute cough 08/29/2023   Visual disturbance 05/31/2023   Social anxiety disorder 01/10/2023   Right hand pain 01/10/2023   Chest pain of uncertain etiology 10/31/2022   DOE (dyspnea on exertion) 10/31/2022   Sinus bradycardia 10/31/2022   Irregular heartbeat 10/11/2022   Rash of face 07/11/2022   Left hand pain 07/11/2022   Screening for cervical cancer 01/08/2022   Cellulitis of face 01/08/2022   Osteopenia 01/08/2022   Elevated alkaline phosphatase level 01/08/2022   Gastroesophageal reflux disease 08/10/2021   Fracture, cervical  vertebra (HCC) 03/28/2021   Encounter for general adult medical examination with abnormal findings 03/05/2021   Insomnia 03/05/2021   Vitamin D  deficiency 03/05/2021   Dementia (HCC) 03/05/2021   Bipolar disorder (HCC) 02/08/2019   Hypothyroidism 02/08/2019   Osteoporosis 02/08/2019   Past Medical History:  Diagnosis Date   Arthritis    Bipolar 1 disorder (HCC)    Bipolar disorder, unspecified (HCC)    Dementia (HCC)    Depression    Hypothyroidism    Thyroid  disease     Past Surgical History:  Procedure Laterality Date   CHOLECYSTECTOMY     SHOULDER SURGERY Right    TONSILLECTOMY      No current facility-administered medications for this encounter.   Current Outpatient Medications  Medication Sig Dispense Refill Last Dose/Taking   levothyroxine  (SYNTHROID ) 50 MCG tablet Take 1 tablet by mouth once daily 90 tablet 0 Taking   omeprazole  (PRILOSEC ) 20 MG capsule Take 1 capsule by mouth once daily (Patient taking differently: Take 20 mg by mouth daily as needed (acid reflux).) 90 capsule 0 Taking Differently   traMADol  (ULTRAM ) 50 MG tablet Take 1 tablet (50 mg total) by mouth every 12 (twelve) hours as needed. (Patient taking differently: Take 50 mg by mouth every 6 (six) hours as needed for moderate pain (pain score 4-6).) 30 tablet 0 Taking Differently   traZODone  (DESYREL ) 100 MG tablet TAKE 1 TABLET BY MOUTH AT BEDTIME 90 tablet 0 Taking   Vitamin D , Ergocalciferol , (DRISDOL ) 1.25 MG (50000 UNIT) CAPS capsule Take 1 capsule (50,000 Units total) by mouth every 7 (seven) days. 20 capsule 1 Taking   Calcium  Carb-Cholecalciferol  (CALTRATE  600+D3) 600-20 MG-MCG TABS Take 600 mg by mouth 2 (two) times daily. (Patient not taking: Reported on 12/16/2023) 60 tablet 3 Not Taking   Cholecalciferol  (VITAMIN D3) 25 MCG (1000 UT) CAPS Take 1 capsule (1,000 Units total) by mouth daily. (Patient not taking: Reported on 12/16/2023) 60 capsule 3 Not Taking   donepezil  (ARICEPT ) 5 MG tablet Take 1  tablet (5 mg total) by mouth daily. 90 tablet 1    Doxepin  HCl 5 % CREA Apply a thin film 4 times/day with at least 3- to 4-hour interval between applications; not recommended for use >8 days. (Patient not taking: Reported on 12/16/2023) 30 g 0 Not Taking   HYDROcodone -acetaminophen  (NORCO/VICODIN) 5-325 MG tablet One tablet every six hours for pain.  Limit 7 days. (Patient not taking: Reported on 11/28/2023) 28 tablet 0    meloxicam  (MOBIC ) 15 MG tablet Take 1 tablet (15 mg total) by mouth daily. (Patient not taking: Reported on 12/16/2023) 30 tablet 5 Not Taking   oxybutynin  (DITROPAN  XL) 5 MG 24 hr tablet Take 1 tablet (5 mg total) by mouth at bedtime. (Patient not taking: Reported on 12/16/2023) 30 tablet 1 Not Taking   venlafaxine  (EFFEXOR ) 75 MG tablet Take 1 tablet (75 mg total) by mouth 2 (two) times daily. 60 tablet 5    Allergies  Allergen Reactions   Prednisone  Other (See Comments)    Kept her up and sent her into a manic mood    Sulfa Antibiotics Swelling    Social History   Tobacco Use   Smoking status: Never   Smokeless tobacco: Never  Substance Use Topics   Alcohol use: Not Currently    Comment: none recently    No family history on file.   Review of Systems  Objective:  Physical Exam Vitals reviewed.  Constitutional:      Appearance: Normal appearance. She is normal weight.  HENT:     Head: Normocephalic and atraumatic.  Eyes:     Extraocular Movements: Extraocular movements intact.     Pupils: Pupils are equal, round, and reactive to light.  Cardiovascular:     Rate and Rhythm: Normal rate and regular rhythm.  Pulmonary:     Effort: Pulmonary effort is normal.     Breath sounds: Normal breath sounds.  Abdominal:     Palpations: Abdomen is soft.  Musculoskeletal:     Cervical back: Normal range of motion and neck supple.  Neurological:     Mental Status: She is alert. Mental status is at baseline.  Psychiatric:        Behavior: Behavior normal.      Vital signs in last 24 hours:    Labs:   Estimated body mass index is 24.21 kg/m as calculated from the following:   Height as of 12/19/23: 5\' 6"  (1.676 m).   Weight as of 12/19/23: 68 kg.   Imaging Review Plain radiographs demonstrate severe degenerative joint disease of the left hip(s). The bone quality appears to be excellent for age and reported activity level.      Assessment/Plan:  End stage arthritis, left hip(s)  The patient history, physical examination, clinical judgement of the provider and imaging studies are consistent with end stage degenerative joint disease of the left hip(s) and total hip arthroplasty is deemed medically necessary. The treatment options including medical management, injection therapy, arthroscopy and arthroplasty were discussed at length. The risks and benefits of total hip arthroplasty were presented and reviewed. The risks due to aseptic loosening, infection, stiffness,  dislocation/subluxation,  thromboembolic complications and other imponderables were discussed.  The patient acknowledged the explanation, agreed to proceed with the plan and consent was signed. Patient is being admitted for inpatient treatment for surgery, pain control, PT, OT, prophylactic antibiotics, VTE prophylaxis, progressive ambulation and ADL's and discharge planning.The patient is planning to be discharged home with home health services

## 2023-12-26 ENCOUNTER — Encounter (HOSPITAL_COMMUNITY)
Admission: RE | Disposition: A | Discharge status: Home / Self Care | Payer: Self-pay | Source: Ambulatory Visit | Attending: Orthopaedic Surgery

## 2023-12-26 ENCOUNTER — Other Ambulatory Visit: Payer: Self-pay

## 2023-12-26 ENCOUNTER — Encounter (HOSPITAL_COMMUNITY): Payer: Self-pay | Admitting: Orthopaedic Surgery

## 2023-12-26 ENCOUNTER — Observation Stay (HOSPITAL_COMMUNITY)
Admission: RE | Admit: 2023-12-26 | Discharge: 2023-12-28 | Disposition: A | Discharge status: Home / Self Care | Source: Ambulatory Visit | Attending: Orthopaedic Surgery | Admitting: Orthopaedic Surgery

## 2023-12-26 ENCOUNTER — Ambulatory Visit (HOSPITAL_COMMUNITY): Payer: Self-pay | Admitting: Certified Registered Nurse Anesthetist

## 2023-12-26 ENCOUNTER — Ambulatory Visit (HOSPITAL_COMMUNITY)

## 2023-12-26 ENCOUNTER — Observation Stay (HOSPITAL_COMMUNITY)

## 2023-12-26 DIAGNOSIS — F0390 Unspecified dementia without behavioral disturbance: Secondary | ICD-10-CM | POA: Insufficient documentation

## 2023-12-26 DIAGNOSIS — E039 Hypothyroidism, unspecified: Secondary | ICD-10-CM | POA: Insufficient documentation

## 2023-12-26 DIAGNOSIS — Z79899 Other long term (current) drug therapy: Secondary | ICD-10-CM | POA: Diagnosis not present

## 2023-12-26 DIAGNOSIS — F418 Other specified anxiety disorders: Secondary | ICD-10-CM | POA: Diagnosis not present

## 2023-12-26 DIAGNOSIS — M1612 Unilateral primary osteoarthritis, left hip: Principal | ICD-10-CM | POA: Diagnosis present

## 2023-12-26 DIAGNOSIS — Z96642 Presence of left artificial hip joint: Secondary | ICD-10-CM

## 2023-12-26 HISTORY — PX: TOTAL HIP ARTHROPLASTY: SHX124

## 2023-12-26 LAB — TYPE AND SCREEN
ABO/RH(D): B POS
Antibody Screen: NEGATIVE

## 2023-12-26 LAB — ABO/RH: ABO/RH(D): B POS

## 2023-12-26 SURGERY — ARTHROPLASTY, HIP, TOTAL, ANTERIOR APPROACH
Anesthesia: Spinal | Site: Hip | Laterality: Left

## 2023-12-26 MED ORDER — FENTANYL CITRATE (PF) 100 MCG/2ML IJ SOLN
INTRAMUSCULAR | Status: AC
  Filled 2023-12-26: qty 2

## 2023-12-26 MED ORDER — SODIUM CHLORIDE 0.9 % IR SOLN
Status: DC | PRN
  Administered 2023-12-26: 1000 mL

## 2023-12-26 MED ORDER — EPHEDRINE SULFATE-NACL 50-0.9 MG/10ML-% IV SOSY
PREFILLED_SYRINGE | INTRAVENOUS | Status: DC | PRN
  Administered 2023-12-26 (×3): 10 mg via INTRAVENOUS
  Administered 2023-12-26: 5 mg via INTRAVENOUS

## 2023-12-26 MED ORDER — PROPOFOL 10 MG/ML IV BOLUS
INTRAVENOUS | Status: DC | PRN
  Administered 2023-12-26: 30 mg via INTRAVENOUS

## 2023-12-26 MED ORDER — CHLORHEXIDINE GLUCONATE 0.12 % MT SOLN
15.0000 mL | Freq: Once | OROMUCOSAL | Status: AC
  Administered 2023-12-26: 15 mL via OROMUCOSAL

## 2023-12-26 MED ORDER — MENTHOL 3 MG MT LOZG: 1.0000 | LOZENGE | OROMUCOSAL | Status: DC | PRN

## 2023-12-26 MED ORDER — ACETAMINOPHEN 325 MG PO TABS
325.0000 mg | ORAL_TABLET | Freq: Four times a day (QID) | ORAL | Status: DC | PRN
  Administered 2023-12-28: 650 mg via ORAL
  Filled 2023-12-26: qty 2

## 2023-12-26 MED ORDER — DIPHENHYDRAMINE HCL 12.5 MG/5ML PO ELIX: 12.5000 mg | ORAL_SOLUTION | ORAL | Status: DC | PRN

## 2023-12-26 MED ORDER — HYDROMORPHONE HCL 1 MG/ML IJ SOLN
INTRAMUSCULAR | Status: AC
  Filled 2023-12-26: qty 1

## 2023-12-26 MED ORDER — OXYCODONE HCL 5 MG/5ML PO SOLN: 5.0000 mg | Freq: Once | ORAL | Status: DC | PRN

## 2023-12-26 MED ORDER — BUPIVACAINE IN DEXTROSE 0.75-8.25 % IT SOLN
INTRATHECAL | Status: DC | PRN
  Administered 2023-12-26: 1.6 mL via INTRATHECAL

## 2023-12-26 MED ORDER — 0.9 % SODIUM CHLORIDE (POUR BTL) OPTIME
TOPICAL | Status: DC | PRN
  Administered 2023-12-26: 1000 mL

## 2023-12-26 MED ORDER — SODIUM CHLORIDE 0.9 % IV SOLN: 12.5000 mg | INTRAVENOUS | Status: DC | PRN

## 2023-12-26 MED ORDER — DOCUSATE SODIUM 100 MG PO CAPS
100.0000 mg | ORAL_CAPSULE | Freq: Two times a day (BID) | ORAL | Status: DC
  Administered 2023-12-26 – 2023-12-28 (×5): 100 mg via ORAL
  Filled 2023-12-26 (×5): qty 1

## 2023-12-26 MED ORDER — VENLAFAXINE HCL 75 MG PO TABS
75.0000 mg | ORAL_TABLET | Freq: Two times a day (BID) | ORAL | Status: DC
  Administered 2023-12-26 – 2023-12-28 (×4): 75 mg via ORAL
  Filled 2023-12-26 (×4): qty 1

## 2023-12-26 MED ORDER — ONDANSETRON HCL 4 MG PO TABS: 4.0000 mg | ORAL_TABLET | Freq: Four times a day (QID) | ORAL | Status: DC | PRN

## 2023-12-26 MED ORDER — PHENOL 1.4 % MT LIQD: 1.0000 | OROMUCOSAL | Status: DC | PRN

## 2023-12-26 MED ORDER — PROPOFOL 1000 MG/100ML IV EMUL
INTRAVENOUS | Status: AC
  Filled 2023-12-26: qty 100

## 2023-12-26 MED ORDER — MIDAZOLAM HCL 5 MG/5ML IJ SOLN
INTRAMUSCULAR | Status: DC | PRN
  Administered 2023-12-26: 2 mg via INTRAVENOUS

## 2023-12-26 MED ORDER — STERILE WATER FOR IRRIGATION IR SOLN
Status: DC | PRN
  Administered 2023-12-26: 1000 mL

## 2023-12-26 MED ORDER — METHOCARBAMOL 500 MG PO TABS
500.0000 mg | ORAL_TABLET | Freq: Four times a day (QID) | ORAL | Status: DC | PRN
  Administered 2023-12-26 – 2023-12-28 (×4): 500 mg via ORAL
  Filled 2023-12-26 (×4): qty 1

## 2023-12-26 MED ORDER — LACTATED RINGERS IV SOLN: INTRAVENOUS | Status: DC

## 2023-12-26 MED ORDER — METOCLOPRAMIDE HCL 5 MG PO TABS: 5.0000 mg | ORAL_TABLET | Freq: Three times a day (TID) | ORAL | Status: DC | PRN

## 2023-12-26 MED ORDER — TRAZODONE HCL 100 MG PO TABS
100.0000 mg | ORAL_TABLET | Freq: Every day | ORAL | Status: DC
  Administered 2023-12-26 – 2023-12-27 (×2): 100 mg via ORAL
  Filled 2023-12-26 (×2): qty 1

## 2023-12-26 MED ORDER — SODIUM CHLORIDE 0.9 % IV SOLN: INTRAVENOUS | Status: DC

## 2023-12-26 MED ORDER — ASPIRIN 81 MG PO CHEW
81.0000 mg | CHEWABLE_TABLET | Freq: Two times a day (BID) | ORAL | Status: DC
  Administered 2023-12-26 – 2023-12-28 (×4): 81 mg via ORAL
  Filled 2023-12-26 (×4): qty 1

## 2023-12-26 MED ORDER — LEVOTHYROXINE SODIUM 50 MCG PO TABS
50.0000 ug | ORAL_TABLET | Freq: Every day | ORAL | Status: DC
  Administered 2023-12-27 – 2023-12-28 (×2): 50 ug via ORAL
  Filled 2023-12-26 (×2): qty 1

## 2023-12-26 MED ORDER — CEFAZOLIN SODIUM-DEXTROSE 2-4 GM/100ML-% IV SOLN
2.0000 g | Freq: Four times a day (QID) | INTRAVENOUS | Status: AC
  Administered 2023-12-26 (×2): 2 g via INTRAVENOUS
  Filled 2023-12-26 (×2): qty 100

## 2023-12-26 MED ORDER — MIDAZOLAM HCL 2 MG/2ML IJ SOLN
INTRAMUSCULAR | Status: AC
  Filled 2023-12-26: qty 2

## 2023-12-26 MED ORDER — ORAL CARE MOUTH RINSE: 15.0000 mL | Freq: Once | OROMUCOSAL | Status: AC

## 2023-12-26 MED ORDER — HYDROMORPHONE HCL 1 MG/ML IJ SOLN
0.2500 mg | INTRAMUSCULAR | Status: DC | PRN
  Administered 2023-12-26 (×2): 0.25 mg via INTRAVENOUS

## 2023-12-26 MED ORDER — ONDANSETRON HCL 4 MG/2ML IJ SOLN
4.0000 mg | Freq: Four times a day (QID) | INTRAMUSCULAR | Status: DC | PRN
  Administered 2023-12-27: 4 mg via INTRAVENOUS
  Filled 2023-12-26: qty 2

## 2023-12-26 MED ORDER — DONEPEZIL HCL 10 MG PO TABS
5.0000 mg | ORAL_TABLET | Freq: Every day | ORAL | Status: DC
  Administered 2023-12-26 – 2023-12-27 (×2): 5 mg via ORAL
  Filled 2023-12-26 (×2): qty 1

## 2023-12-26 MED ORDER — PANTOPRAZOLE SODIUM 40 MG PO TBEC
40.0000 mg | DELAYED_RELEASE_TABLET | Freq: Every day | ORAL | Status: DC
  Administered 2023-12-26 – 2023-12-28 (×3): 40 mg via ORAL
  Filled 2023-12-26 (×3): qty 1

## 2023-12-26 MED ORDER — METHOCARBAMOL 1000 MG/10ML IJ SOLN: 500.0000 mg | Freq: Four times a day (QID) | INTRAMUSCULAR | Status: DC | PRN

## 2023-12-26 MED ORDER — CHLORHEXIDINE GLUCONATE 4 % EX SOLN: 1.0000 | CUTANEOUS | 1 refills | Status: AC

## 2023-12-26 MED ORDER — OXYCODONE HCL 5 MG PO TABS: 5.0000 mg | ORAL_TABLET | Freq: Once | ORAL | Status: DC | PRN

## 2023-12-26 MED ORDER — PHENYLEPHRINE HCL-NACL 20-0.9 MG/250ML-% IV SOLN
INTRAVENOUS | Status: DC | PRN
  Administered 2023-12-26: 30 ug/min via INTRAVENOUS

## 2023-12-26 MED ORDER — MUPIROCIN 2 % EX OINT: 1.0000 | TOPICAL_OINTMENT | Freq: Two times a day (BID) | CUTANEOUS | 0 refills | Status: AC

## 2023-12-26 MED ORDER — ALUM & MAG HYDROXIDE-SIMETH 200-200-20 MG/5ML PO SUSP: 30.0000 mL | ORAL | Status: DC | PRN

## 2023-12-26 MED ORDER — POVIDONE-IODINE 10 % EX SWAB
2.0000 | Freq: Once | CUTANEOUS | Status: DC
Start: 2023-12-26 — End: 2023-12-26

## 2023-12-26 MED ORDER — TRANEXAMIC ACID-NACL 1000-0.7 MG/100ML-% IV SOLN
1000.0000 mg | INTRAVENOUS | Status: AC
  Administered 2023-12-26: 1000 mg via INTRAVENOUS
  Filled 2023-12-26: qty 100

## 2023-12-26 MED ORDER — METOCLOPRAMIDE HCL 5 MG/ML IJ SOLN: 5.0000 mg | Freq: Three times a day (TID) | INTRAMUSCULAR | Status: DC | PRN

## 2023-12-26 MED ORDER — HYDROMORPHONE HCL 1 MG/ML IJ SOLN
0.5000 mg | INTRAMUSCULAR | Status: DC | PRN
  Administered 2023-12-26: 1 mg via INTRAVENOUS
  Filled 2023-12-26: qty 1

## 2023-12-26 MED ORDER — PROPOFOL 500 MG/50ML IV EMUL
INTRAVENOUS | Status: DC | PRN
  Administered 2023-12-26: 75 ug/kg/min via INTRAVENOUS

## 2023-12-26 MED ORDER — CEFAZOLIN SODIUM-DEXTROSE 2-4 GM/100ML-% IV SOLN
2.0000 g | INTRAVENOUS | Status: AC
  Administered 2023-12-26: 2 g via INTRAVENOUS
  Filled 2023-12-26: qty 100

## 2023-12-26 MED ORDER — OXYCODONE HCL 5 MG PO TABS
10.0000 mg | ORAL_TABLET | ORAL | Status: DC | PRN
  Administered 2023-12-27 (×2): 15 mg via ORAL
  Filled 2023-12-26: qty 3
  Filled 2023-12-26: qty 2
  Filled 2023-12-26: qty 3
  Filled 2023-12-26: qty 2
  Filled 2023-12-26: qty 3

## 2023-12-26 MED ORDER — ONDANSETRON HCL 4 MG/2ML IJ SOLN
INTRAMUSCULAR | Status: AC
  Filled 2023-12-26: qty 2

## 2023-12-26 MED ORDER — OXYCODONE HCL 5 MG PO TABS
5.0000 mg | ORAL_TABLET | ORAL | Status: DC | PRN
  Administered 2023-12-26 – 2023-12-27 (×5): 10 mg via ORAL
  Administered 2023-12-28: 5 mg via ORAL
  Administered 2023-12-28: 10 mg via ORAL
  Filled 2023-12-26 (×5): qty 2

## 2023-12-26 MED ORDER — FENTANYL CITRATE (PF) 100 MCG/2ML IJ SOLN
INTRAMUSCULAR | Status: DC | PRN
Start: 2023-12-26 — End: 2023-12-26
  Administered 2023-12-26 (×2): 50 ug via INTRAVENOUS

## 2023-12-26 MED ORDER — EPHEDRINE 5 MG/ML INJ
INTRAVENOUS | Status: AC
  Filled 2023-12-26: qty 5

## 2023-12-26 SURGICAL SUPPLY — 36 items
BAG COUNTER SPONGE SURGICOUNT (BAG) ×1 IMPLANT
BAG ZIPLOCK 12X15 (MISCELLANEOUS) IMPLANT
BENZOIN TINCTURE PRP APPL 2/3 (GAUZE/BANDAGES/DRESSINGS) IMPLANT
BLADE SAW SGTL 18X1.27X75 (BLADE) ×1 IMPLANT
COVER PERINEAL POST (MISCELLANEOUS) ×1 IMPLANT
COVER SURGICAL LIGHT HANDLE (MISCELLANEOUS) ×1 IMPLANT
DRAPE FOOT SWITCH (DRAPES) ×1 IMPLANT
DRAPE STERI IOBAN 125X83 (DRAPES) ×1 IMPLANT
DRAPE U-SHAPE 47X51 STRL (DRAPES) ×2 IMPLANT
DRSG AQUACEL AG ADV 3.5X10 (GAUZE/BANDAGES/DRESSINGS) ×1 IMPLANT
DURAPREP 26ML APPLICATOR (WOUND CARE) ×1 IMPLANT
ELECT PENCIL ROCKER SW 15FT (MISCELLANEOUS) ×1 IMPLANT
ELECT REM PT RETURN 15FT ADLT (MISCELLANEOUS) ×1 IMPLANT
GAUZE XEROFORM 1X8 LF (GAUZE/BANDAGES/DRESSINGS) IMPLANT
GLOVE BIO SURGEON STRL SZ7.5 (GLOVE) ×1 IMPLANT
GLOVE BIOGEL PI IND STRL 8 (GLOVE) ×2 IMPLANT
GLOVE ECLIPSE 8.0 STRL XLNG CF (GLOVE) ×1 IMPLANT
GOWN STRL REUS W/ TWL XL LVL3 (GOWN DISPOSABLE) ×2 IMPLANT
HEAD CERAMIC 36 PLUS5 (Hips) IMPLANT
HOLDER FOLEY CATH W/STRAP (MISCELLANEOUS) ×1 IMPLANT
KIT TURNOVER KIT A (KITS) IMPLANT
LINER NEUTRAL 52X36MM PLUS 4 (Liner) IMPLANT
PACK ANTERIOR HIP CUSTOM (KITS) ×1 IMPLANT
PIN SECTOR W/GRIP ACE CUP 52MM (Hips) IMPLANT
SET HNDPC FAN SPRY TIP SCT (DISPOSABLE) ×1 IMPLANT
STAPLER SKIN PROX WIDE 3.9 (STAPLE) IMPLANT
STEM FEM ACTIS STD SZ4 (Stem) IMPLANT
STRIP CLOSURE SKIN 1/2X4 (GAUZE/BANDAGES/DRESSINGS) IMPLANT
SUT ETHIBOND NAB CT1 #1 30IN (SUTURE) ×1 IMPLANT
SUT ETHILON 2 0 PS N (SUTURE) IMPLANT
SUT MNCRL AB 4-0 PS2 18 (SUTURE) IMPLANT
SUT VIC AB 0 CT1 36 (SUTURE) ×1 IMPLANT
SUT VIC AB 1 CT1 36 (SUTURE) ×1 IMPLANT
SUT VIC AB 2-0 CT1 TAPERPNT 27 (SUTURE) ×2 IMPLANT
TRAY FOLEY MTR SLVR 14FR STAT (SET/KITS/TRAYS/PACK) IMPLANT
YANKAUER SUCT BULB TIP NO VENT (SUCTIONS) ×1 IMPLANT

## 2023-12-26 NOTE — TOC Transition Note (Signed)
 Transition of Care [facility]) - Discharge Note   Patient Details  Name: Anita Berry MRN: [id number] Date of Birth: 1961/01/10  [facility]) CM/SW Contact:  Delilah Fend, LCSW Phone Number: 12/26/2023, 3:14 PM   Clinical Narrative:     Met with pt who confirms she has needed DME in the home.  Pt aware that HHPT prearranged with [facility] via ortho MD office prior to surgery.  No further TOC needs.  Final next level of care: Home w Home Health Services Barriers to Discharge: No Barriers Identified   Patient Goals and CMS Choice Patient states their goals for this hospitalization and ongoing recovery are:: return home          Discharge Placement                       Discharge Plan and Services Additional resources added to the After Visit Summary for                  DME Arranged: N/A DME Agency: NA       HH Arranged: PT HH Agency: [facility]        Social Drivers of Health (SDOH) Interventions SDOH Screenings   Food Insecurity: No Food Insecurity (11/27/2023)  Housing: Low Risk  (11/27/2023)  Transportation Needs: No Transportation Needs (11/27/2023)  Alcohol Screen: Low Risk  (11/27/2023)  Depression (PHQ2-9): Low Risk  (11/28/2023)  Financial Resource Strain: High Risk (11/27/2023)  Physical Activity: Sufficiently Active (11/27/2023)  Social Connections: Socially Isolated (11/27/2023)  Stress: No Stress Concern Present (11/27/2023)  Tobacco Use: Low Risk  (12/26/2023)     Readmission Risk Interventions     No data to display

## 2023-12-26 NOTE — Transfer of Care (Signed)
 Immediate Anesthesia Transfer of Care Note  Patient: Anita Berry  Procedure(s) Performed: ARTHROPLASTY, HIP, TOTAL, ANTERIOR APPROACH (Left: Hip)  Patient Location: PACU  Anesthesia Type:Spinal  Level of Consciousness: awake and patient cooperative  Airway & Oxygen Therapy: Patient Spontanous Breathing and Patient connected to nasal cannula oxygen  Post-op Assessment: Report given to RN and Post -op Vital signs reviewed and stable  Post vital signs: Reviewed and stable  Last Vitals:  Vitals Value Taken Time  BP 96/60 12/26/23 1235  Temp    Pulse 63 12/26/23 1237  Resp 11 12/26/23 1237  SpO2 99 % 12/26/23 1237  Vitals shown include unfiled device data.  Last Pain:  Vitals:   12/26/23 0938  TempSrc:   PainSc: 0-No pain         Complications: No notable events documented.

## 2023-12-26 NOTE — Interval H&P Note (Signed)
 History and Physical Interval Note: The patient understands that she is here today for a left total hip replacement to treat her significant left hip pain and arthritis.  There has been no acute or interval change in her medical status.  The risks and benefits of surgery have been discussed in detail and informed consent has been obtained.  The left operative hip has been marked.  12/26/2023 9:52 AM  Anita Berry  has presented today for surgery, with the diagnosis of osteoarthritis left hip.  The various methods of treatment have been discussed with the patient and family. After consideration of risks, benefits and other options for treatment, the patient has consented to  Procedure(s): ARTHROPLASTY, HIP, TOTAL, ANTERIOR APPROACH (Left) as a surgical intervention.  The patient's history has been reviewed, patient examined, no change in status, stable for surgery.  I have reviewed the patient's chart and labs.  Questions were answered to the patient's satisfaction.     Arnie Lao

## 2023-12-26 NOTE — Plan of Care (Signed)
  Problem: Activity: Goal: Risk for activity intolerance will decrease Outcome: Progressing   Problem: Nutrition: Goal: Adequate nutrition will be maintained Outcome: Progressing   Problem: Safety: Goal: Ability to remain free from injury will improve Outcome: Progressing   Problem: Pain Managment: Goal: General experience of comfort will improve and/or be controlled Outcome: Progressing

## 2023-12-26 NOTE — Anesthesia Postprocedure Evaluation (Signed)
 Anesthesia Post Note  Patient: Ellard Gunning  Procedure(s) Performed: ARTHROPLASTY, HIP, TOTAL, ANTERIOR APPROACH (Left: Hip)     Patient location during evaluation: PACU Anesthesia Type: Spinal Level of consciousness: awake and alert Pain management: pain level controlled Vital Signs Assessment: post-procedure vital signs reviewed and stable Respiratory status: spontaneous breathing, nonlabored ventilation and respiratory function stable Cardiovascular status: blood pressure returned to baseline and stable Postop Assessment: no apparent nausea or vomiting Anesthetic complications: no   No notable events documented.  Last Vitals:  Vitals:   12/26/23 1400 12/26/23 1407  BP: (!) 92/57 (!) 104/57  Pulse: 62 61  Resp: 14 12  Temp:    SpO2: 100% 100%    Last Pain:  Vitals:   12/26/23 1345  TempSrc:   PainSc: 1                  Earvin Goldberg

## 2023-12-26 NOTE — Anesthesia Procedure Notes (Signed)
 Spinal  Patient location during procedure: OR Start time: 12/26/2023 11:06 AM End time: 12/26/2023 11:10 AM Staffing Performed: resident/CRNA  Resident/CRNA: Rochell Chroman, CRNA Performed by: Rochell Chroman, CRNA Authorized by: Earvin Goldberg, MD   Preanesthetic Checklist Completed: patient identified, IV checked, site marked, risks and benefits discussed, surgical consent, monitors and equipment checked, pre-op evaluation and timeout performed Spinal Block Patient position: sitting Prep: DuraPrep Patient monitoring: heart rate, blood pressure, continuous pulse ox and cardiac monitor Approach: midline Location: L3-4 Needle Needle type: Pencan  Needle gauge: 24 G Needle length: 10 cm Needle insertion depth: 8 cm Assessment Events: CSF return

## 2023-12-26 NOTE — Op Note (Signed)
 Operative Note  Date of operation: 12/26/2023 Preoperative diagnosis: Left hip primary osteoarthritis Postoperative diagnosis: Same  Procedure: Left direct anterior total hip arthroplasty  Implants: Implant Name Type Inv. Item Serial No. Manufacturer Lot No. LRB No. Used Action  PIN SECTOR W/GRIP ACE CUP - [id number] Hips PIN SECTOR W/GRIP ACE CUP  [employer] [id number] Left 1 Implanted  LINER NEUTRAL 52X36MM PLUS 4 - [id number] Liner LINER NEUTRAL 52X36MM PLUS 4  DEPU[employer]TH[employer] [id number] Left 1 Implanted  STEM FEM ACTIS STD SZ4 - [id number] Stem STEM FEM ACTIS STD SZ4  DEPU[employer]TH[employer] [id number] Left 1 Implanted  HEAD CERAMIC 36 PLUS5 - [id number] Hips HEAD CERAMIC 36 PLUS5  DEPUY ORTH[employer] [id number] Left 1 Implanted   Surgeon: Jeanella Milan. Lucienne Ryder, MD System: Malena Scull, PA-C  Anesthesia: Spinal Antibiotics: IV Ancef  EBL: 150 cc Complications: None  Indications: The patient is an active 63 year old female with debilitating arthritis involving her left hip.  This has been seen on clinical exam and x-ray findings.  She has tried and failed all forms of conservative treatment.  At this point her left hip pain is daily and it is detrimentally affecting her mobility, her quality of life and her activities day living and she does wish to proceed with a hip replacement and we agree with this as well.  We did discuss the risks of acute blood loss anemia, nerve vessel injury, fracture, infection, DVT, dislocation, implant failure, leg length differences and wound healing issues.  She understands that our goals are hopefully decreased pain, improved mobility and improved quality of life.  Procedure description: After informed consent was obtained and the appropriate left hip was marked, the patient was brought to the operating room and set up on the stretcher where spinal anesthesia was obtained.  She was then laid in supine position on the stretcher and a Foley catheter was  placed.  Traction boots were placed on both her feet and next she was placed supine on the Hana fracture table with a perineal post in place and both legs in inline skeletal traction devices with no traction applied.  Her left hip and pelvis were assessed radiographically.  The left hip was prepped and draped with DuraPrep and sterile drapes.  A timeout was called and she was notified of the correct patient the correct left hip.  An incision was then made just inferior and posterior to the ASIS and carried slightly obliquely down the leg.  Dissection was carried down to the tensor fascia lata muscle and the tensor fascia was divided longitudinally to proceed with a direct anterior approach to the hip.  Circumflex vessels were identified and cauterized.  The hip capsule was identified and opened up in L-type format finding a large joint effusion.  Cobra retractors were placed around the medial and lateral femoral neck and a femoral neck cut was made with an oscillating saw just proximal to the lesser trochanter.  This cut was completed with an osteotome.  A corkscrew guide was placed in the femoral head and the femoral head was removed in its entirety finding a wide area devoid of cartilage.  A bent Hohmann was placed over the medial acetabular rim and remnants of the acetabular labrum and other debris removed.  Reaming was initiated from a size 43 reamer and stepwise increments going up to a size 51 reamer with all reamers placed under direct visualization and the last reamer placed under direct fluoroscopy in order to obtain the depth reaming, the inclination and  the anteversion.  The real DePuy sector GRIPTION acetabular opponent size 52 was then placed without difficulty followed by 36+4 polyethylene liner.  Attention was then turned to the femur.  With the left leg externally rotated to 120 degrees, extended and adducted, a Mueller retractors placed medially and a Hohmann tractor by the greater trochanter.  The  lateral joint capsule was released and a box cutting osteotome was used in her femoral canal.  Broaching was then initiated using the Actis broaching system from a size 0 going up to a size 4.  With a size 4 in place we trialed a standard offset femoral neck and a 36+1.5 trial hip ball.  The left leg was brought over and up and with traction and internal rotation reduced in the pelvis.  Based on clinical and radiographic assessment we felt like we needed a little more leg length.  We then dislocated the hip remove the trial components.  We placed the real Actis femoral component with standard offset size 4 and the real 36+5 ceramic head ball.  Again this was reduced into the acetabulum we are pleased with leg length, offset, range of motion and stability assessed both clinically and radiographically.  We then irrigated the soft tissue with normal saline solution.  The joint capsule was closed with interrupted #1 Ethibond suture followed by #1 Vicryl to close the tensor fascia.  0 Vicryl was used to close deep tissue and 2-0 Vicryl was used to close the pate tissue.  The skin was closed with staples.  An Aquacel dressing was applied.  The patient was taken off of the Hana table and taken the recovery room.  Malena Scull, PA-C did assist during the entire case and beginning to end and his assistance was crucial and medically necessary for soft tissue management and retraction, helping guide implant placement and a layered closure of the wound.

## 2023-12-26 NOTE — Evaluation (Signed)
 Physical Therapy Evaluation Patient Details Name: Anita Berry MRN: [id number] DOB: [birth year] Today's Date: 12/26/2023  History of Present Illness  Pt s/p L THR and with hx of bipolar, dementia  Clinical Impression  Pt s/p L THR and presents with decreased L LE strength/ROM and post op pain limiting functional mobility.  Pt should progress to dc home with limited assist.        If plan is discharge home, recommend the following: A little help with walking and/or transfers;A little help with bathing/dressing/bathroom;Assistance with cooking/housework;Assist for transportation;Help with stairs or ramp for entrance   Can travel by private vehicle        Equipment Recommendations Rolling walker (2 wheels)  Recommendations for Other Services       Functional Status Assessment Patient has had a recent decline in their functional status and demonstrates the ability to make significant improvements in function in a reasonable and predictable amount of time.     Precautions / Restrictions Precautions Precautions: Fall Restrictions Weight Bearing Restrictions Per Provider Order: No Other Position/Activity Restrictions: WBAT      Mobility  Bed Mobility Overal bed mobility: Needs Assistance Bed Mobility: Supine to Sit     Supine to sit: Min assist, Mod assist     General bed mobility comments: increased time with cues for sequence and use of R LE to self assist    Transfers Overall transfer level: Needs assistance Equipment used: Rolling walker (2 wheels) Transfers: Sit to/from Stand Sit to Stand: Min assist           General transfer comment: cues for LE management and use of UEs to self assist    Ambulation/Gait Ambulation/Gait assistance: Min assist Gait Distance (Feet): 32 Feet Assistive device: Rolling walker (2 wheels) Gait Pattern/deviations: Step-to pattern, Decreased step length - right, Decreased step length - left, Shuffle, Trunk flexed Gait velocity:  decr     General Gait Details: cues for sequence, posture and position from [employer]            Wheelchair Mobility     Tilt Bed    Modified Rankin (Stroke Patients Only)       Balance Overall balance assessment: Needs assistance Sitting-balance support: No upper extremity supported, Feet supported Sitting balance-Leahy Scale: Good     Standing balance support: Bilateral upper extremity supported Standing balance-Leahy Scale: Poor                               Pertinent Vitals/Pain      Home Living Family/patient expects to be discharged to:: Private residence Living Arrangements: Other relatives Available Help at Discharge: Family;Available 24 hours/day (lives with sister who can not physically assist - is in [facility] with [facility]) Type of Home: House Home Access: Ramped entrance       Home Layout: One level Home Equipment: Rollator (4 wheels)      Prior Function Prior Level of Function : Independent/Modified Independent                     Extremity/Trunk Assessment   Upper Extremity Assessment Upper Extremity Assessment: Overall WFL for tasks assessed    Lower Extremity Assessment Lower Extremity Assessment: LLE deficits/detail    Cervical / Trunk Assessment Cervical / Trunk Assessment: Normal  Communication   Communication Communication: No apparent difficulties    Cognition Arousal: Alert Behavior During Therapy: WFL for tasks assessed/performed, Impulsive   PT -  Cognitive impairments: No apparent impairments                         Following commands: Intact       Cueing Cueing Techniques: Verbal cues     General Comments      Exercises Total Joint Exercises Ankle Circles/Pumps: AROM, Both, 15 reps, Supine   Assessment/Plan    PT Assessment Patient needs continued PT services  PT Problem List Decreased strength;Decreased range of motion;Decreased activity tolerance;Decreased balance;Decreased  mobility;Decreased knowledge of use of DME;Pain       PT Treatment Interventions DME instruction;Gait training;Stair training;Functional mobility training;Therapeutic activities;Therapeutic exercise;Patient/family education    PT Goals (Current goals can be found in the Care Plan section)  Acute Rehab PT Goals Patient Stated Goal: Regain IND and get back to my horses PT Goal Formulation: With patient Time For Goal Achievement: 01/02/24 Potential to Achieve Goals: Good    Frequency 7X/week     Co-evaluation               AM-PAC PT "6 Clicks" Mobility  Outcome Measure Help needed turning from your back to your side while in a flat bed without using bedrails?: A Little Help needed moving from lying on your back to sitting on the side of a flat bed without using bedrails?: A Little Help needed moving to and from a bed to a chair (including a wheelchair)?: A Little Help needed standing up from a chair using your arms (e.g., wheelchair or bedside chair)?: A Little Help needed to walk in hospital room?: A Little Help needed climbing 3-5 steps with a railing? : A Lot 6 Click Score: 17    End of Session Equipment Utilized During Treatment: Gait belt Activity Tolerance: Patient tolerated treatment well Patient left: in chair;with call bell/phone within reach;with chair alarm set Nurse Communication: Mobility status PT Visit Diagnosis: Difficulty in walking, not elsewhere classified (R26.2)    Time: 1610-9604 PT Time Calculation (min) (ACUTE ONLY): 19 min   Charges:   PT Evaluation $PT Eval Low Complexity: 1 Low   PT General Charges $$ ACUTE PT VISIT: 1 Visit         Thedora Finlay PT Acute Rehabilitation Services Pager [phone] Office [phone]   Kristopher Delk 12/26/2023, 5:21 PM

## 2023-12-26 NOTE — Anesthesia Preprocedure Evaluation (Signed)
 Anesthesia Evaluation  Patient identified by MRN, date of birth, ID band Patient awake    Reviewed: Allergy & Precautions, H&P , NPO status , Patient's Chart, lab work & pertinent test results  Airway Mallampati: II  TM Distance: >3 FB Neck ROM: Full    Dental no notable dental hx.    Pulmonary neg pulmonary ROS   Pulmonary exam normal breath sounds clear to auscultation       Cardiovascular negative cardio ROS Normal cardiovascular exam Rhythm:Regular Rate:Normal     Neuro/Psych   Anxiety Depression Bipolar Disorder  Dementia negative neurological ROS  negative psych ROS   GI/Hepatic Neg liver ROS,GERD  ,,  Endo/Other  Hypothyroidism    Renal/GU negative Renal ROS  negative genitourinary   Musculoskeletal  (+) Arthritis , Osteoarthritis,    Abdominal   Peds negative pediatric ROS (+)  Hematology negative hematology ROS (+)   Anesthesia Other Findings   Reproductive/Obstetrics negative OB ROS                             Anesthesia Physical Anesthesia Plan  ASA: 3  Anesthesia Plan: Spinal   Post-op Pain Management:    Induction: Intravenous  PONV Risk Score and Plan: 2 and Propofol  infusion and Treatment may vary due to age or medical condition  Airway Management Planned: Simple Face Mask  Additional Equipment:   Intra-op Plan:   Post-operative Plan:   Informed Consent: I have reviewed the patients History and Physical, chart, labs and discussed the procedure including the risks, benefits and alternatives for the proposed anesthesia with the patient or authorized representative who has indicated his/her understanding and acceptance.     Dental advisory given  Plan Discussed with: CRNA  Anesthesia Plan Comments:        Anesthesia Quick Evaluation

## 2023-12-27 DIAGNOSIS — M1612 Unilateral primary osteoarthritis, left hip: Secondary | ICD-10-CM | POA: Diagnosis not present

## 2023-12-27 LAB — BASIC METABOLIC PANEL WITH GFR
Anion gap: 9 (ref 5–15)
BUN: 13 mg/dL (ref 8–23)
CO2: 21 mmol/L — ABNORMAL LOW (ref 22–32)
Calcium: 7.8 mg/dL — ABNORMAL LOW (ref 8.9–10.3)
Chloride: 105 mmol/L (ref 98–111)
Creatinine, Ser: 0.79 mg/dL (ref 0.44–1.00)
GFR, Estimated: >60 mL/min (ref >60)
Glucose, Bld: 119 mg/dL — ABNORMAL HIGH (ref 70–99)
Potassium: 3 mmol/L — ABNORMAL LOW (ref 3.5–5.1)
Sodium: 135 mmol/L (ref 135–145)

## 2023-12-27 LAB — CBC
HCT: 33.8 % — ABNORMAL LOW (ref 36.0–46.0)
Hemoglobin: 11.2 g/dL — ABNORMAL LOW (ref 12.0–15.0)
MCH: 32 pg (ref 26.0–34.0)
MCHC: 33.1 g/dL (ref 30.0–36.0)
MCV: 96.6 fL (ref 80.0–100.0)
Platelets: 130 10*3/uL — ABNORMAL LOW (ref 150–400)
RBC: 3.5 MIL/uL — ABNORMAL LOW (ref 3.87–5.11)
RDW: 12.5 % (ref 11.5–15.5)
WBC: 6 10*3/uL (ref 4.0–10.5)
nRBC: 0 % (ref 0.0–0.2)

## 2023-12-27 MED ORDER — OXYCODONE HCL 5 MG PO TABS: 5.0000 mg | ORAL_TABLET | Freq: Four times a day (QID) | ORAL | 0 refills | Status: DC | PRN

## 2023-12-27 MED ORDER — ASPIRIN 81 MG PO CHEW: 81.0000 mg | CHEWABLE_TABLET | Freq: Two times a day (BID) | ORAL | 0 refills | Status: AC

## 2023-12-27 MED ORDER — METHOCARBAMOL 500 MG PO TABS: 500.0000 mg | ORAL_TABLET | Freq: Four times a day (QID) | ORAL | 1 refills | Status: DC | PRN

## 2023-12-27 NOTE — Plan of Care (Signed)
   Problem: Activity: Goal: Risk for activity intolerance will decrease Outcome: Progressing   Problem: Nutrition: Goal: Adequate nutrition will be maintained Outcome: Progressing   Problem: Coping: Goal: Level of anxiety will decrease Outcome: Progressing

## 2023-12-27 NOTE — Progress Notes (Signed)
 Subjective: 1 Day Post-Op Procedure(s) (LRB): ARTHROPLASTY, HIP, TOTAL, ANTERIOR APPROACH (Left) Patient reports pain as moderate.  Slow progress with PT.  Objective: Vital signs in last 24 hours: Temp:  [97.4 F (36.3 C)-99.4 F (37.4 C)] 98.7 F (37.1 C) (05/03 0637) Pulse Rate:  [53-79] 77 (05/03 0637) Resp:  [12-20] 16 (05/03 0637) BP: (92-120)/(48-101) 108/58 (05/03 0637) SpO2:  [97 %-100 %] 97 % (05/03 0637)  Intake/Output from previous day: 05/02 0701 - 05/03 0700 In: 4011.3 [P.O.:1200; I.V.:2511.3; IV Piggyback:300] Out: 1900 [Urine:1750; Blood:150] Intake/Output this shift: Total I/O In: 0  Out: 300 [Urine:300]  Recent Labs    12/27/23 0402  HGB 11.2*   Recent Labs    12/27/23 0402  WBC 6.0  RBC 3.50*  HCT 33.8*  PLT 130*   Recent Labs    12/27/23 0402  NA 135  K 3.0*  CL 105  CO2 21*  BUN 13  CREATININE 0.79  GLUCOSE 119*  CALCIUM  7.8*   No results for input(s): "LABPT", "INR" in the last 72 hours.  Sensation intact distally Intact pulses distally Dorsiflexion/Plantar flexion intact Incision: dressing C/D/I   Assessment/Plan: 1 Day Post-Op Procedure(s) (LRB): ARTHROPLASTY, HIP, TOTAL, ANTERIOR APPROACH (Left) Up with therapy Plan for discharge tomorrow Discharge home with home health      Arnie Lao 12/27/2023, 10:28 AM

## 2023-12-27 NOTE — TOC CM/SW Note (Signed)
 CSW ordered pt RW to be delivered to room prior to dc through [employer].

## 2023-12-27 NOTE — Progress Notes (Signed)
 Physical Therapy Treatment Patient Details Name: Anita Berry MRN: [id number] DOB: Mar 07, 1961 Today's Date: 12/27/2023   History of Present Illness Pt s/p L THR and with hx of bipolar, dementia    PT Comments  Pt continues very cooperative and progressing with mobility but pain limited this am.  Pt expressing concern with dc home stating she really has no assistance as she lives with sister who is in [place].    If plan is discharge home, recommend the following: A little help with walking and/or transfers;A little help with bathing/dressing/bathroom;Assistance with cooking/housework;Assist for transportation;Help with stairs or ramp for entrance   Can travel by private vehicle        Equipment Recommendations  Rolling walker (2 wheels)    Recommendations for Other Services       Precautions / Restrictions Precautions Precautions: Fall Restrictions Weight Bearing Restrictions Per Provider Order: No Other Position/Activity Restrictions: WBAT     Mobility  Bed Mobility Overal bed mobility: Needs Assistance Bed Mobility: Supine to Sit     Supine to sit: Min assist     General bed mobility comments: increased time with cues for sequence and use of R LE to self assist    Transfers Overall transfer level: Needs assistance Equipment used: Rolling walker (2 wheels) Transfers: Sit to/from Stand Sit to Stand: Min assist           General transfer comment: cues for LE management and use of UEs to self assist    Ambulation/Gait Ambulation/Gait assistance: Min assist Gait Distance (Feet): 49 Feet (and additional 15' to bathroom) Assistive device: Rolling walker (2 wheels) Gait Pattern/deviations: Step-to pattern, Decreased step length - right, Decreased step length - left, Shuffle, Trunk flexed Gait velocity: decr     General Gait Details: cues for sequence, posture and position from [employer]             Wheelchair Mobility     Tilt Bed    Modified Rankin  (Stroke Patients Only)       Balance Overall balance assessment: Needs assistance Sitting-balance support: No upper extremity supported, Feet supported Sitting balance-Leahy Scale: Good     Standing balance support: Single extremity supported Standing balance-Leahy Scale: Poor                              Communication Communication Communication: No apparent difficulties  Cognition Arousal: Alert Behavior During Therapy: WFL for tasks assessed/performed, Impulsive   PT - Cognitive impairments: No apparent impairments                         Following commands: Intact      Cueing Cueing Techniques: Verbal cues  Exercises Total Joint Exercises Ankle Circles/Pumps: AROM, Both, 15 reps, Supine    General Comments        Pertinent Vitals/Pain Pain Assessment Pain Assessment: 0-10 Pain Score: 8  Pain Location: L hip Pain Descriptors / Indicators: Aching, Sore Pain Intervention(s): Limited activity within patient's tolerance, Monitored during session, Premedicated before session, Ice applied    Home Living                          Prior Function            PT Goals (current goals can now be found in the care plan section) Acute Rehab PT Goals Patient Stated Goal: Regain IND  and get back to my horses PT Goal Formulation: With patient Time For Goal Achievement: 01/02/24 Potential to Achieve Goals: Good Progress towards PT goals: Progressing toward goals    Frequency    7X/week      PT Plan      Co-evaluation              AM-PAC PT "6 Clicks" Mobility   Outcome Measure  Help needed turning from your back to your side while in a flat bed without using bedrails?: A Little Help needed moving from lying on your back to sitting on the side of a flat bed without using bedrails?: A Little Help needed moving to and from a bed to a chair (including a wheelchair)?: A Little Help needed standing up from a chair using your  arms (e.g., wheelchair or bedside chair)?: A Little Help needed to walk in hospital room?: A Little Help needed climbing 3-5 steps with a railing? : A Lot 6 Click Score: 17    End of Session Equipment Utilized During Treatment: Gait belt Activity Tolerance: Patient tolerated treatment well Patient left: in chair;with call bell/phone within reach;with chair alarm set Nurse Communication: Mobility status PT Visit Diagnosis: Difficulty in walking, not elsewhere classified (R26.2)     Time: 8119-1478 PT Time Calculation (min) (ACUTE ONLY): 25 min  Charges:    $Gait Training: 8-22 mins $Therapeutic Activity: 8-22 mins PT General Charges $$ ACUTE PT VISIT: 1 Visit                     Thedora Finlay PT Acute Rehabilitation Services Pager [phone] Office [phone]    Danie Diehl 12/27/2023, 11:48 AM

## 2023-12-27 NOTE — Discharge Instructions (Signed)
 Per [facility] clinic policy, our goal is ensure optimal postoperative pain control with a multimodal pain management strategy. For all [facility] patients, our goal is to wean post-operative narcotic medications by 6 weeks post-operatively. If this is not possible due to utilization of pain medication prior to surgery, your [facility] doctor will support your acute post-operative pain control for the first 6 weeks postoperatively, with a plan to transition you back to your primary pain team following that. Anita Berry will work to ensure a Therapist, occupational.  INSTRUCTIONS AFTER JOINT REPLACEMENT   Remove items at home which could result in a fall. This includes throw rugs or furniture in walking pathways ICE to the affected joint every three hours while awake for 30 minutes at a time, for at least the first 3-5 days, and then as needed for pain and swelling.  Continue to use ice for pain and swelling. You may notice swelling that will progress down to the foot and ankle.  This is normal after surgery.  Elevate your leg when you are not up walking on it.   Continue to use the breathing machine you got in the hospital (incentive spirometer) which will help keep your temperature down.  It is common for your temperature to cycle up and down following surgery, especially at night when you are not up moving around and exerting yourself.  The breathing machine keeps your lungs expanded and your temperature down.   DIET:  As you were doing prior to hospitalization, we recommend a well-balanced diet.  DRESSING / WOUND CARE / SHOWERING  Keep the surgical dressing until follow up.  The dressing is water proof, so you can shower without any extra covering.  IF THE DRESSING FALLS OFF or the wound gets wet inside, change the dressing with sterile gauze.  Please use good hand washing techniques before changing the dressing.  Do not use any lotions or creams on the incision until instructed by your surgeon.     ACTIVITY  Increase activity slowly as tolerated, but follow the weight bearing instructions below.   No driving for 6 weeks or until further direction given by your physician.  You cannot drive while taking narcotics.  No lifting or carrying greater than 10 lbs. until further directed by your surgeon. Avoid periods of inactivity such as sitting longer than an hour when not asleep. This helps prevent blood clots.  You may return to work once you are authorized by your doctor.     WEIGHT BEARING   Weight bearing as tolerated with assist device (walker, cane, etc) as directed, use it as long as suggested by your surgeon or therapist, typically at least 4-6 weeks.   EXERCISES  Results after joint replacement surgery are often greatly improved when you follow the exercise, range of motion and muscle strengthening exercises prescribed by your doctor. Safety measures are also important to protect the joint from further injury. Any time any of these exercises cause you to have increased pain or swelling, decrease what you are doing until you are comfortable again and then slowly increase them. If you have problems or questions, call your caregiver or physical therapist for advice.   Rehabilitation is important following a joint replacement. After just a few days of immobilization, the muscles of the leg can become weakened and shrink (atrophy).  These exercises are designed to build up the tone and strength of the thigh and leg muscles and to improve motion. Often times heat used for twenty to thirty minutes before  working out will loosen up your tissues and help with improving the range of motion but do not use heat for the first two weeks following surgery (sometimes heat can increase post-operative swelling).   These exercises can be done on a training (exercise) mat, on the floor, on a table or on a bed. Use whatever works the best and is most comfortable for you.    Use music or television  while you are exercising so that the exercises are a pleasant break in your day. This will make your life better with the exercises acting as a break in your routine that you can look forward to.   Perform all exercises about fifteen times, three times per day or as directed.  You should exercise both the operative leg and the other leg as well.  Exercises include:   Quad Sets - Tighten up the muscle on the front of the thigh (Quad) and hold for 5-10 seconds.   Straight Leg Raises - With your knee straight (if you were given a brace, keep it on), lift the leg to 60 degrees, hold for 3 seconds, and slowly lower the leg.  Perform this exercise against resistance later as your leg gets stronger.  Leg Slides: Lying on your back, slowly slide your foot toward your buttocks, bending your knee up off the floor (only go as far as is comfortable). Then slowly slide your foot back down until your leg is flat on the floor again.  Angel Wings: Lying on your back spread your legs to the side as far apart as you can without causing discomfort.  Hamstring Strength:  Lying on your back, push your heel against the floor with your leg straight by tightening up the muscles of your buttocks.  Repeat, but this time bend your knee to a comfortable angle, and push your heel against the floor.  You may put a pillow under the heel to make it more comfortable if necessary.   A rehabilitation program following joint replacement surgery can speed recovery and prevent re-injury in the future due to weakened muscles. Contact your doctor or a physical therapist for more information on knee rehabilitation.    CONSTIPATION  Constipation is defined medically as fewer than three stools per week and severe constipation as less than one stool per week.  Even if you have a regular bowel pattern at home, your normal regimen is likely to be disrupted due to multiple reasons following surgery.  Combination of anesthesia, postoperative  narcotics, change in appetite and fluid intake all can affect your bowels.   YOU MUST use at least one of the following options; they are listed in order of increasing strength to get the job done.  They are all available over the counter, and you may need to use some, POSSIBLY even all of these options:    Drink plenty of fluids (prune juice may be helpful) and high fiber foods Colace 100 mg by mouth twice a day  Senokot for constipation as directed and as needed Dulcolax (bisacodyl), take with full glass of water  Miralax (polyethylene glycol) once or twice a day as needed.  If you have tried all these things and are unable to have a bowel movement in the first 3-4 days after surgery call either your surgeon or your primary doctor.    If you experience loose stools or diarrhea, hold the medications until you stool forms back up.  If your symptoms do not get better within 1 week  or if they get worse, check with your doctor.  If you experience "the worst abdominal pain ever" or develop nausea or vomiting, please contact the office immediately for further recommendations for treatment.   ITCHING:  If you experience itching with your medications, try taking only a single pain pill, or even half a pain pill at a time.  You can also use Benadryl over the counter for itching or also to help with sleep.   TED HOSE STOCKINGS:  Use stockings on both legs until for at least 2 weeks or as directed by physician office. They may be removed at night for sleeping.  MEDICATIONS:  See your medication summary on the "After Visit Summary" that nursing will review with you.  You may have some home medications which will be placed on hold until you complete the course of blood thinner medication.  It is important for you to complete the blood thinner medication as prescribed.  PRECAUTIONS:  If you experience chest pain or shortness of breath - call 911 immediately for transfer to the hospital emergency department.    If you develop a fever greater that 101 F, purulent drainage from wound, increased redness or drainage from wound, foul odor from the wound/dressing, or calf pain - CONTACT YOUR SURGEON.                                                   FOLLOW-UP APPOINTMENTS:  If you do not already have a post-op appointment, please call the office for an appointment to be seen by your surgeon.  Guidelines for how soon to be seen are listed in your "After Visit Summary", but are typically between 1-4 weeks after surgery.  OTHER INSTRUCTIONS:   Knee Replacement:  Do not place pillow under knee, focus on keeping the knee straight while resting. CPM instructions: 0-90 degrees, 2 hours in the morning, 2 hours in the afternoon, and 2 hours in the evening. Place foam block, curve side up under heel at all times except when in CPM or when walking.  DO NOT modify, tear, cut, or change the foam block in any way.  POST-OPERATIVE OPIOID TAPER INSTRUCTIONS: It is important to wean off of your opioid medication as soon as possible. If you do not need pain medication after your surgery it is ok to stop day one. Opioids include: Codeine, Hydrocodone(Norco, Vicodin), Oxycodone(Percocet, oxycontin) and hydromorphone amongst others.  Long term and even short term use of opiods can cause: Increased pain response Dependence Constipation Depression Respiratory depression And more.  Withdrawal symptoms can include Flu like symptoms Nausea, vomiting And more Techniques to manage these symptoms Hydrate well Eat regular healthy meals Stay active Use relaxation techniques(deep breathing, meditating, yoga) Do Not substitute Alcohol to help with tapering If you have been on opioids for less than two weeks and do not have pain than it is ok to stop all together.  Plan to wean off of opioids This plan should start within one week post op of your joint replacement. Maintain the same interval or time between taking each dose  and first decrease the dose.  Cut the total daily intake of opioids by one tablet each day Next start to increase the time between doses. The last dose that should be eliminated is the evening dose.   MAKE SURE YOU:  Understand these instructions.  Get help right away if you are not doing well or get worse.    Thank you for letting us be a part of your medical care team.  It is a privilege we respect greatly.  We hope these instructions will help you stay on track for a fast and full recovery!      Dental Antibiotics:  In most cases prophylactic antibiotics for Dental procdeures after total joint surgery are not necessary.  Exceptions are as follows:  1. History of prior total joint infection  2. Severely immunocompromised (Organ Transplant, cancer chemotherapy, Rheumatoid biologic meds such as Humera)  3. Poorly controlled diabetes (A1C &gt; 8.0, blood glucose over 200)  If you have one of these conditions, contact your surgeon for an antibiotic prescription, prior to your dental procedure.

## 2023-12-27 NOTE — Progress Notes (Signed)
 Physical Therapy Treatment Patient Details Name: Anita Berry MRN: [id number] DOB: [birth year] Today's Date: 12/27/2023   History of Present Illness Pt s/p L THR and with hx of bipolar, dementia    PT Comments  Pt continues very cooperative and with noted improvement in activity tolerance but continues limited by elevated pain level with mobility.    If plan is discharge home, recommend the following: A little help with walking and/or transfers;A little help with bathing/dressing/bathroom;Assistance with cooking/housework;Assist for transportation;Help with stairs or ramp for entrance   Can travel by private vehicle        Equipment Recommendations  Rolling walker (2 wheels)    Recommendations for Other Services       Precautions / Restrictions Precautions Precautions: Fall Restrictions Weight Bearing Restrictions Per Provider Order: No Other Position/Activity Restrictions: WBAT     Mobility  Bed Mobility Overal bed mobility: Needs Assistance Bed Mobility: Supine to Sit     Supine to sit: Min assist     General bed mobility comments: Pt up in chair and requests back to same    Transfers Overall transfer level: Needs assistance Equipment used: Rolling walker (2 wheels) Transfers: Sit to/from Stand Sit to Stand: Min assist, Contact guard assist           General transfer comment: cues for LE management and use of UEs to self assist    Ambulation/Gait Ambulation/Gait assistance: Min assist, Contact guard assist Gait Distance (Feet): 100 Feet Assistive device: Rolling walker (2 wheels) Gait Pattern/deviations: Step-to pattern, Decreased step length - right, Decreased step length - left, Shuffle, Trunk flexed Gait velocity: decr     General Gait Details: cues for sequence, posture and position from [employer]             Wheelchair Mobility     Tilt Bed    Modified Rankin (Stroke Patients Only)       Balance Overall balance assessment: Needs  assistance Sitting-balance support: No upper extremity supported, Feet supported Sitting balance-Leahy Scale: Good     Standing balance support: Single extremity supported Standing balance-Leahy Scale: Poor                              Communication Communication Communication: No apparent difficulties  Cognition Arousal: Alert Behavior During Therapy: WFL for tasks assessed/performed, Impulsive   PT - Cognitive impairments: No apparent impairments                         Following commands: Intact      Cueing Cueing Techniques: Verbal cues  Exercises Total Joint Exercises Ankle Circles/Pumps: AROM, Both, 15 reps, Supine    General Comments        Pertinent Vitals/Pain Pain Assessment Pain Assessment: 0-10 Pain Score: 7  Pain Location: L hip Pain Descriptors / Indicators: Aching, Sore Pain Intervention(s): Limited activity within patient's tolerance, Monitored during session, Premedicated before session, Ice applied    Home Living                          Prior Function            PT Goals (current goals can now be found in the care plan section) Acute Rehab PT Goals Patient Stated Goal: Regain IND and get back to my horses PT Goal Formulation: With patient Time For Goal Achievement: 01/02/24 Potential to Achieve  Goals: Good Progress towards PT goals: Progressing toward goals    Frequency    7X/week      PT Plan      Co-evaluation              AM-PAC PT "6 Clicks" Mobility   Outcome Measure  Help needed turning from your back to your side while in a flat bed without using bedrails?: A Little Help needed moving from lying on your back to sitting on the side of a flat bed without using bedrails?: A Little Help needed moving to and from a bed to a chair (including a wheelchair)?: A Little Help needed standing up from a chair using your arms (e.g., wheelchair or bedside chair)?: A Little Help needed to walk in  hospital room?: A Little Help needed climbing 3-5 steps with a railing? : A Lot 6 Click Score: 17    End of Session Equipment Utilized During Treatment: Gait belt Activity Tolerance: Patient tolerated treatment well Patient left: in chair;with call bell/phone within reach;with chair alarm set Nurse Communication: Mobility status PT Visit Diagnosis: Difficulty in walking, not elsewhere classified (R26.2)     Time: 5638-7564 PT Time Calculation (min) (ACUTE ONLY): 25 min  Charges:    $Gait Training: 8-22 mins PT General Charges $$ ACUTE PT VISIT: 1 Visit                     Thedora Finlay PT Acute Rehabilitation Services Pager [phone] Office [phone]    Anita Berry 12/27/2023, 4:24 PM

## 2023-12-27 NOTE — Plan of Care (Signed)
  Problem: Coping: Goal: Level of anxiety will decrease Outcome: Progressing   Problem: Safety: Goal: Ability to remain free from injury will improve Outcome: Progressing   Problem: Education: Goal: Knowledge of the prescribed therapeutic regimen will improve Outcome: Progressing   Problem: Clinical Measurements: Goal: Postoperative complications will be avoided or minimized Outcome: Progressing   Problem: Pain Management: Goal: Pain level will decrease with appropriate interventions Outcome: Progressing

## 2023-12-28 DIAGNOSIS — M1612 Unilateral primary osteoarthritis, left hip: Secondary | ICD-10-CM | POA: Diagnosis not present

## 2023-12-28 NOTE — Progress Notes (Signed)
 Subjective: 2 Days Post-Op Procedure(s) (LRB): ARTHROPLASTY, HIP, TOTAL, ANTERIOR APPROACH (Left) Patient reports pain as moderate.  Feeling much better this morning and doing quite well  Objective: Vital signs in last 24 hours: Temp:  [98.7 F (37.1 C)-99.8 F (37.7 C)] 98.7 F (37.1 C) (05/04 0424) Pulse Rate:  [62-69] 62 (05/04 0424) Resp:  [16-18] 16 (05/04 0424) BP: (97-115)/(53-54) 115/54 (05/04 0424) SpO2:  [93 %-97 %] 97 % (05/03 2128)  Intake/Output from previous day: 05/03 0701 - 05/04 0700 In: 330 [P.O.:330] Out: 300 [Urine:300] Intake/Output this shift: No intake/output data recorded.  Recent Labs    12/27/23 0402  HGB 11.2*   Recent Labs    12/27/23 0402  WBC 6.0  RBC 3.50*  HCT 33.8*  PLT 130*   Recent Labs    12/27/23 0402  NA 135  K 3.0*  CL 105  CO2 21*  BUN 13  CREATININE 0.79  GLUCOSE 119*  CALCIUM  7.8*   No results for input(s): "LABPT", "INR" in the last 72 hours.  Sensation intact distally Intact pulses distally Dorsiflexion/Plantar flexion intact Incision: dressing C/D/I   Assessment/Plan: 2 Days Post-Op Procedure(s) (LRB): ARTHROPLASTY, HIP, TOTAL, ANTERIOR APPROACH (Left) Discharge today home with home health services      Anita Berry 12/28/2023, 7:53 AM

## 2023-12-28 NOTE — Progress Notes (Signed)
 Physical Therapy Treatment Patient Details Name: Anita Berry MRN: [id number] DOB: 26-Mar-1961 Today's Date: 12/28/2023   History of Present Illness Pt s/p L THR and with hx of bipolar, dementia    PT Comments  Pt motivated and reporting improved pain control this am.  HEP initiated, pt up to ambulate increased distance in hall, negotiated stairs, and reviewed car transfers.  Marked improvement in activity tolerance with noted decreased level of assist for all tasks.  Pt eager for dc home this date.    If plan is discharge home, recommend the following: A little help with walking and/or transfers;A little help with bathing/dressing/bathroom;Assistance with cooking/housework;Assist for transportation;Help with stairs or ramp for entrance   Can travel by private vehicle        Equipment Recommendations  Rolling walker (2 wheels)    Recommendations for Other Services       Precautions / Restrictions Precautions Precautions: Fall Restrictions Weight Bearing Restrictions Per Provider Order: No Other Position/Activity Restrictions: WBAT     Mobility  Bed Mobility Overal bed mobility: Needs Assistance Bed Mobility: Supine to Sit     Supine to sit: Contact guard, Supervision     General bed mobility comments: pt self assisting L LE with R LE    Transfers Overall transfer level: Needs assistance Equipment used: Rolling walker (2 wheels) Transfers: Sit to/from Stand Sit to Stand: Contact guard assist, Supervision           General transfer comment: cues for LE management and use of UEs to self assist    Ambulation/Gait Ambulation/Gait assistance: Contact guard assist, Supervision Gait Distance (Feet): 150 Feet Assistive device: Rolling walker (2 wheels) Gait Pattern/deviations: Decreased step length - right, Decreased step length - left, Shuffle, Trunk flexed, Step-to pattern, Step-through pattern Gait velocity: decr     General Gait Details: cues for sequence, posture  and position from RW   Stairs Stairs: Yes Stairs assistance: Min assist Stair Management: One rail Right, Step to pattern, Forwards, With cane Number of Stairs: 3 General stair comments: cues for sequence and foot/cane placement   Wheelchair Mobility     Tilt Bed    Modified Rankin (Stroke Patients Only)       Balance Overall balance assessment: Needs assistance Sitting-balance support: No upper extremity supported, Feet supported Sitting balance-Leahy Scale: Good     Standing balance support: No upper extremity supported Standing balance-Leahy Scale: Fair                              Hotel manager: No apparent difficulties  Cognition Arousal: Alert Behavior During Therapy: WFL for tasks assessed/performed, Impulsive   PT - Cognitive impairments: No apparent impairments                         Following commands: Intact      Cueing Cueing Techniques: Verbal cues  Exercises Total Joint Exercises Ankle Circles/Pumps: AROM, Both, 15 reps, Supine Quad Sets: AROM, Both, 10 reps, Supine Heel Slides: AAROM, Left, 20 reps, Supine Hip ABduction/ADduction: AAROM, Left, 15 reps, Supine    General Comments        Pertinent Vitals/Pain Pain Assessment Pain Assessment: 0-10 Pain Score: 5  Pain Location: L hip Pain Descriptors / Indicators: Aching, Sore Pain Intervention(s): Limited activity within patient's tolerance, Monitored during session, Premedicated before session, Ice applied    Home Living  Prior Function            PT Goals (current goals can now be found in the care plan section) Acute Rehab PT Goals Patient Stated Goal: Regain IND and get back to my horses PT Goal Formulation: With patient Time For Goal Achievement: 01/02/24 Potential to Achieve Goals: Good Progress towards PT goals: Progressing toward goals    Frequency    7X/week      PT Plan       Co-evaluation              AM-PAC PT "6 Clicks" Mobility   Outcome Measure  Help needed turning from your back to your side while in a flat bed without using bedrails?: A Little Help needed moving from lying on your back to sitting on the side of a flat bed without using bedrails?: A Little Help needed moving to and from a bed to a chair (including a wheelchair)?: A Little Help needed standing up from a chair using your arms (e.g., wheelchair or bedside chair)?: A Little Help needed to walk in hospital room?: A Little Help needed climbing 3-5 steps with a railing? : A Little 6 Click Score: 18    End of Session Equipment Utilized During Treatment: Gait belt Activity Tolerance: Patient tolerated treatment well Patient left: in chair;with call bell/phone within reach;with chair alarm set Nurse Communication: Mobility status PT Visit Diagnosis: Difficulty in walking, not elsewhere classified (R26.2)     Time: 1191-4782 PT Time Calculation (min) (ACUTE ONLY): 34 min  Charges:    $Gait Training: 8-22 mins $Therapeutic Exercise: 8-22 mins PT General Charges $$ ACUTE PT VISIT: 1 Visit                     Thedora Finlay PT Acute Rehabilitation Services Pager [phone] Office [phone]   Lizmary Nader 12/28/2023, 10:34 AM

## 2023-12-28 NOTE — Discharge Summary (Signed)
 Patient ID: Anita Berry MRN: [id number] DOB/AGE: 1960-10-21 63 y.o.  Admit date: 12/26/2023 Discharge date: 12/28/2023  Admission Diagnoses:  Unilateral primary osteoarthritis, left hip  Discharge Diagnoses:  Principal Problem:   Unilateral primary osteoarthritis, left hip Active Problems:   Status post total replacement of left hip   Past Medical History:  Diagnosis Date   Arthritis    Bipolar 1 disorder (HCC)    Bipolar disorder, unspecified (HCC)    Dementia (HCC)    Depression    Hypothyroidism    Thyroid  disease     Surgeries: Procedure(s): ARTHROPLASTY, HIP, TOTAL, ANTERIOR APPROACH on 12/26/2023   Consultants (if any):   Discharged Condition: Improved  Hospital Course: Anita Berry is an 63 y.o. female who was admitted 12/26/2023 with a diagnosis of Unilateral primary osteoarthritis, left hip and went to the operating room on 12/26/2023 and underwent the above named procedures.    She was given perioperative antibiotics:  Anti-infectives (From admission, onward)    Start     Dose/Rate Route Frequency Ordered Stop   12/26/23 1700  ceFAZolin  (ANCEF ) IVPB 2g/100 mL premix        2 g 200 mL/hr over 30 Minutes Intravenous Every 6 hours 12/26/23 1243 12/26/23 2303   12/26/23 0930  ceFAZolin  (ANCEF ) IVPB 2g/100 mL premix        2 g 200 mL/hr over 30 Minutes Intravenous On call to O.R. 12/26/23 0919 12/26/23 1116     .  She was given sequential compression devices, early ambulation, and appropriate chemoprophylaxis for DVT prophylaxis.  She benefited maximally from the hospital stay and there were no complications.    Recent vital signs:  Vitals:   12/27/23 2128 12/28/23 0424  BP: (!) 108/53 (!) 115/54  Pulse: 69 62  Resp: 18 16  Temp: 99.8 F (37.7 C) 98.7 F (37.1 C)  SpO2: 97%     Recent laboratory studies:  Lab Results  Component Value Date   HGB 11.2 (L) 12/27/2023   HGB 13.4 12/19/2023   HGB 13.1 11/28/2023   Lab Results  Component Value Date    WBC 6.0 12/27/2023   PLT 130 (L) 12/27/2023   No results found for: "INR" Lab Results  Component Value Date   NA 135 12/27/2023   K 3.0 (L) 12/27/2023   CL 105 12/27/2023   CO2 21 (L) 12/27/2023   BUN 13 12/27/2023   CREATININE 0.79 12/27/2023   GLUCOSE 119 (H) 12/27/2023    Discharge Medications:   Allergies as of 12/28/2023       Reactions   Prednisone  Other (See Comments)   Kept her up and sent her into a manic mood    Sulfa Antibiotics Swelling        Medication List     STOP taking these medications    traMADol  50 MG tablet Commonly known as: ULTRAM        TAKE these medications    aspirin  81 MG chewable tablet Chew 1 tablet (81 mg total) by mouth 2 (two) times daily.   chlorhexidine  4 % external liquid Commonly known as: HIBICLENS Apply 15 mLs (1 Application total) topically as directed for 30 doses. Use as directed daily for 5 days every other week for 6 weeks.   donepezil  5 MG tablet Commonly known as: ARICEPT  Take 1 tablet (5 mg total) by mouth daily.   levothyroxine  50 MCG tablet Commonly known as: SYNTHROID  Take 1 tablet by mouth once daily   methocarbamol  500 MG tablet  Commonly known as: ROBAXIN  Take 1 tablet (500 mg total) by mouth every 6 (six) hours as needed for muscle spasms.   mupirocin ointment 2 % Commonly known as: BACTROBAN Place 1 Application into the nose 2 (two) times daily for 60 doses. Use as directed 2 times daily for 5 days every other week for 6 weeks.   omeprazole  20 MG capsule Commonly known as: PRILOSEC  Take 1 capsule by mouth once daily   oxyCODONE  5 MG immediate release tablet Commonly known as: Oxy IR/ROXICODONE  Take 1-2 tablets (5-10 mg total) by mouth every 6 (six) hours as needed for moderate pain (pain score 4-6) (pain score 4-6).   traZODone  100 MG tablet Commonly known as: DESYREL  TAKE 1 TABLET BY MOUTH AT BEDTIME   venlafaxine  75 MG tablet Commonly known as: EFFEXOR  Take 1 tablet (75 mg total) by  mouth 2 (two) times daily.   Vitamin D  (Ergocalciferol ) 1.25 MG (50000 UNIT) Caps capsule Commonly known as: DRISDOL  Take 1 capsule (50,000 Units total) by mouth every 7 (seven) days.               Durable Medical Equipment  (From admission, onward)           Start     Ordered   12/27/23 1504  For home use only DME Walker rolling  Once       Question Answer Comment  Walker: With 5 Inch Wheels   Patient needs a walker to treat with the following condition Difficulty in walking, not elsewhere classified      12/27/23 1505   12/26/23 1442  DME 3 n 1  Once        12/26/23 1441   12/26/23 1442  DME Walker rolling  Once       Question Answer Comment  Walker: With 5 Inch Wheels   Patient needs a walker to treat with the following condition Status post total replacement of left hip      12/26/23 1441            Diagnostic Studies: DG Pelvis Portable Result Date: 12/26/2023 CLINICAL DATA:  Status post left hip replacement. EXAM: PORTABLE PELVIS 1-2 VIEWS COMPARISON:  None Available. FINDINGS: Left hip arthroplasty in expected alignment. No periprosthetic lucency or fracture. Recent postsurgical change includes air and edema in the soft tissues. Lateral skin staples in place. IMPRESSION: Left hip arthroplasty without immediate postoperative complication. Electronically Signed   By: Chadwick Colonel M.D.   On: 12/26/2023 15:25   DG HIP UNILAT WITH PELVIS 1V LEFT Result Date: 12/26/2023 CLINICAL DATA:  Total left hip arthroplasty. Intraoperative fluoroscopy. EXAM: DG HIP (WITH OR WITHOUT PELVIS) 1V*L* COMPARISON:  Pelvis and left hip radiographs 09/03/2023 FINDINGS: Images were performed intraoperatively without the presence of a radiologist. Interval total left hip arthroplasty. No hardware complication is seen. Total fluoroscopy images: To Total fluoroscopy time: 25 seconds Total dose: Radiation Exposure Index (as provided by the fluoroscopic device): 2.5 mGy air Kerma Please see  intraoperative findings for further detail. IMPRESSION: Intraoperative fluoroscopy for total left hip arthroplasty. Electronically Signed   By: Bertina Broccoli M.D.   On: 12/26/2023 13:10   DG C-Arm 1-60 Min-No Report Result Date: 12/26/2023 Fluoroscopy was utilized by the requesting physician.  No radiographic interpretation.    Disposition:      Follow-up Information     [facility] Follow up.   Why: to provide home physical therapy visits Contact information: [phone]  SignedWilhelmenia Harada 12/28/2023, 7:53 AM

## 2023-12-28 NOTE — Plan of Care (Signed)

## 2023-12-29 ENCOUNTER — Encounter (HOSPITAL_COMMUNITY): Payer: Self-pay | Admitting: Orthopaedic Surgery

## 2023-12-31 ENCOUNTER — Telehealth: Payer: Self-pay | Admitting: Orthopaedic Surgery

## 2023-12-31 NOTE — Telephone Encounter (Signed)
 Patient aware that low grade fevers are common after joint replacement surgeries, she said she has not had a BM so I told her to get herself a stool softner but to call me if she had any more questions and I would be glad to help

## 2023-12-31 NOTE — Telephone Encounter (Signed)
 Patient called. She has been having fevers for 2 days now. Would like a call. [phone]

## 2024-01-02 ENCOUNTER — Other Ambulatory Visit: Payer: Self-pay | Admitting: Orthopaedic Surgery

## 2024-01-02 ENCOUNTER — Telehealth: Payer: Self-pay | Admitting: Orthopaedic Surgery

## 2024-01-02 MED ORDER — OXYCODONE HCL 5 MG PO TABS
5.0000 mg | ORAL_TABLET | Freq: Four times a day (QID) | ORAL | 0 refills | Status: DC | PRN
Start: 2024-01-02 — End: 2024-01-08

## 2024-01-02 NOTE — Telephone Encounter (Signed)
 Rx refill Oxycodone   [facility] in [address]

## 2024-01-05 ENCOUNTER — Telehealth: Payer: Self-pay

## 2024-01-05 NOTE — Telephone Encounter (Signed)
 Annette With HHPT called concerning patient's L THA.  Stated that patient had taken a shower and that her bandage wasn't sealed and that a little water  may have gotten on the incision.  Per Gomez Lathe, bandage should be changed and apply new bandage that was given to patient.  Murlean Armour voiced that she understands.  Advised her to call the office back with any other concerns.

## 2024-01-08 ENCOUNTER — Encounter: Payer: Self-pay | Admitting: Orthopaedic Surgery

## 2024-01-08 ENCOUNTER — Ambulatory Visit: Admitting: Orthopaedic Surgery

## 2024-01-08 DIAGNOSIS — M1612 Unilateral primary osteoarthritis, left hip: Secondary | ICD-10-CM

## 2024-01-08 MED ORDER — OXYCODONE HCL 5 MG PO TABS: 5.0000 mg | ORAL_TABLET | Freq: Three times a day (TID) | ORAL | 0 refills | Status: DC | PRN

## 2024-01-08 NOTE — Progress Notes (Signed)
 The patient is a 63 year old female who is here for first postoperative visit status post a left total hip replacement.  She is ambulating with a walker and doing well.  She has been compliant with a baby aspirin  twice daily and with compressive hose.  She denies any calf pain.  On exam her left hip incision looks good.  The staples are removed and Steri-Strips applied.  There is no significant seroma.  Her leg lengths appear equal.  Her calf is soft.  There is no foot ankle swelling.  She will continue to increase her activities as comfort allows.  She can stop her baby aspirin .  She needs 1 more refill of pain medicine but she is using it sparingly.  Will see her back in a month to see how she is doing overall.  She knows to go slow.

## 2024-01-12 ENCOUNTER — Telehealth: Payer: Self-pay | Admitting: Orthopaedic Surgery

## 2024-01-12 NOTE — Telephone Encounter (Signed)
 Patient called. She would like to know does she continue with the nose spray? Her cb# [phone]

## 2024-01-12 NOTE — Telephone Encounter (Signed)
Patient aware of the message from [facility]

## 2024-01-14 ENCOUNTER — Ambulatory Visit (INDEPENDENT_AMBULATORY_CARE_PROVIDER_SITE_OTHER): Admitting: Orthopaedic Surgery

## 2024-01-14 ENCOUNTER — Other Ambulatory Visit (INDEPENDENT_AMBULATORY_CARE_PROVIDER_SITE_OTHER)

## 2024-01-14 ENCOUNTER — Ambulatory Visit

## 2024-01-14 ENCOUNTER — Encounter: Payer: Self-pay | Admitting: Orthopaedic Surgery

## 2024-01-14 ENCOUNTER — Ambulatory Visit (INDEPENDENT_AMBULATORY_CARE_PROVIDER_SITE_OTHER): Payer: Self-pay

## 2024-01-14 ENCOUNTER — Telehealth: Payer: Self-pay

## 2024-01-14 DIAGNOSIS — Z96642 Presence of left artificial hip joint: Secondary | ICD-10-CM | POA: Diagnosis not present

## 2024-01-14 DIAGNOSIS — M25532 Pain in left wrist: Secondary | ICD-10-CM | POA: Diagnosis not present

## 2024-01-14 DIAGNOSIS — G8929 Other chronic pain: Secondary | ICD-10-CM | POA: Diagnosis not present

## 2024-01-14 DIAGNOSIS — M79645 Pain in left finger(s): Secondary | ICD-10-CM | POA: Diagnosis not present

## 2024-01-14 NOTE — Telephone Encounter (Signed)
 I called patient and advised.

## 2024-01-14 NOTE — Progress Notes (Signed)
 My thumb hurts.  She is left hand dominant.  She has pain of the left thumb at the base.  She had DeQuervains in the past but her pain is at the joint of the left thumb today.  She has no redness. She has not done anything for it.  Left thumb has good ROM but tender at the base of the first metacarpal and positive grind sign.  Also some pain at the IP joint.  NV intact.    X-rays were done of the left wrist, reported separately.  Encounter Diagnosis  Name Primary?   Chronic pain of left thumb Yes   I have suggested Aleve one to two twice a day after eating.  Use Aspercreme, Biofreeze or Voltaren Gel.  Return in three weeks.  Call if any problem.  Precautions discussed.  Electronically Signed Pleasant Brilliant, MD 5/21/202510:08 AM

## 2024-01-14 NOTE — Patient Instructions (Addendum)
 Take aleve onc a day to help with Arthritis if that doesn't help then 2 a day.  Get Volatren gel rub  Follow up in three weeks to see Dr. Iline Mallory.

## 2024-01-20 ENCOUNTER — Other Ambulatory Visit: Payer: Self-pay | Admitting: Family Medicine

## 2024-01-20 DIAGNOSIS — F3131 Bipolar disorder, current episode depressed, mild: Secondary | ICD-10-CM

## 2024-01-26 ENCOUNTER — Telehealth: Payer: Self-pay

## 2024-01-26 NOTE — Telephone Encounter (Signed)
 Worked patient in on Wed. 9:15AM

## 2024-01-26 NOTE — Telephone Encounter (Signed)
 Triage message:  Patient called to report a new pain she is having in the left hip - replaced about 1 month ago. Since Wed/Thurs of last week she has had pain when she first stands up and with the first few steps. The pain subsides with walking, but she has had to go back to using her cane. The pain is very bad at night. She is calling because she is "not understanding what is going on," why she is having this pain. She mentioned no injury. CB [phone].

## 2024-01-28 ENCOUNTER — Encounter: Payer: Self-pay | Admitting: Physician Assistant

## 2024-01-28 ENCOUNTER — Other Ambulatory Visit (INDEPENDENT_AMBULATORY_CARE_PROVIDER_SITE_OTHER): Payer: Self-pay

## 2024-01-28 ENCOUNTER — Ambulatory Visit: Admitting: Physician Assistant

## 2024-01-28 DIAGNOSIS — Z96642 Presence of left artificial hip joint: Secondary | ICD-10-CM

## 2024-01-28 MED ORDER — METHOCARBAMOL 500 MG PO TABS: 500.0000 mg | ORAL_TABLET | Freq: Four times a day (QID) | ORAL | 1 refills | Status: AC | PRN

## 2024-01-28 NOTE — Progress Notes (Signed)
 HPI: Anita Berry returns today status post left total hip arthroplasty 12/26/2023.  She states that her hip gives way with the first step when she has been sitting for period time.  He walks out quickly though.  She is no longer using any assistive device.  She denies any numbness tingling down the leg.  She is taking Aleve only for pain.  No known injury.  She has been walking and doing hip exercises including hip abduction exercises.  Review of systems see HPI otherwise negative or noncontributory.  Physical exam: General Well-developed well-nourished female in no acute distress.  Does have a slight antalgic gait with the first steps but then after that walks with a normal gait without assistive device. Bilateral hips: Good range of motion of both hips.  Nontender over the right trochanteric region slightly tender over the left trochanteric region. Lower extremities: Calves are supple and nontender bilaterally.  Dorsiflexion plantarflexion bilateral ankles intact.  Radiographs: AP pelvis lateral view left hip: No acute fractures.  Status post left total hip arthroplasty well-seated components.  No acute findings.  Plan: Will have her stop hip abduction exercises.  Will refill her methocarbamol .  She takes this mainly at night.  She will follow-up with us  in 4 weeks sooner if there is any questions concerns.

## 2024-02-04 ENCOUNTER — Ambulatory Visit: Admitting: Orthopaedic Surgery

## 2024-02-11 ENCOUNTER — Encounter: Admitting: Orthopaedic Surgery

## 2024-02-16 ENCOUNTER — Other Ambulatory Visit: Payer: Self-pay | Admitting: Family Medicine

## 2024-02-16 MED ORDER — LEVOTHYROXINE SODIUM 50 MCG PO TABS: 50.0000 ug | ORAL_TABLET | Freq: Every day | ORAL | 0 refills | Status: DC

## 2024-02-16 NOTE — Telephone Encounter (Signed)
 Copied from CRM [id number]. Topic: Clinical - Medication Refill >> Feb 16, 2024  9:30 AM Thersia C wrote: Medication: levothyroxine  (SYNTHROID ) 50 MCG tablet  Has the patient contacted their pharmacy? Yes (Agent: If no, request that the patient contact the pharmacy for the refill. If patient does not wish to contact the pharmacy document the reason why and proceed with request.) (Agent: If yes, when and what did the pharmacy advise?)  This is the patient's preferred pharmacy:  [facility] [phone] - [place], [place] - [address] AT [facility] [address]  [place] [address] Phone: [phone] Fax: [phone]  Is this the correct pharmacy for this prescription? Yes If no, delete pharmacy and type the correct one.   Has the prescription been filled recently? No  Is the patient out of the medication? Yes  Has the patient been seen for an appointment in the last year OR does the patient have an upcoming appointment? Yes  Can we respond through MyChart? Yes  Agent: Please be advised that Rx refills may take up to 3 business days. We ask that you follow-up with your pharmacy.

## 2024-02-24 ENCOUNTER — Telehealth: Payer: Self-pay | Admitting: Family Medicine

## 2024-02-24 DIAGNOSIS — G4700 Insomnia, unspecified: Secondary | ICD-10-CM

## 2024-02-24 MED ORDER — TRAZODONE HCL 100 MG PO TABS: 100.0000 mg | ORAL_TABLET | Freq: Every day | ORAL | 0 refills | Status: DC

## 2024-02-24 NOTE — Telephone Encounter (Signed)
 Copied from CRM [id number]. Topic: Clinical - Medication Refill >> Feb 24, 2024  8:34 AM Precious C wrote: Medication: traZODone  (DESYREL ) 100 MG tablet  Has the patient contacted their pharmacy? No (Agent: If no, request that the patient contact the pharmacy for the refill. If patient does not wish to contact the pharmacy document the reason why and proceed with request.) No, pt just to do an early refill request and also update the pharmacy location to where this medication should go.   This is the patient's preferred pharmacy:  [facility] [phone] - [place], [place] - [address] AT [facility] OF [address] [place] [address] Phone: [phone] Fax: [phone]  Is this the correct pharmacy for this prescription? Yes If no, delete pharmacy and type the correct one.   Has the prescription been filled recently? No  Is the patient out of the medication? No, will be out by this weekend  Has the patient been seen for an appointment in the last year OR does the patient have an upcoming appointment? Yes  Can we respond through MyChart? Yes  Agent: Please be advised that Rx refills may take up to 3 business days. We ask that you follow-up with your pharmacy.

## 2024-02-25 ENCOUNTER — Encounter: Payer: Self-pay | Admitting: Orthopaedic Surgery

## 2024-02-25 ENCOUNTER — Ambulatory Visit (INDEPENDENT_AMBULATORY_CARE_PROVIDER_SITE_OTHER): Admitting: Orthopaedic Surgery

## 2024-02-25 DIAGNOSIS — Z96642 Presence of left artificial hip joint: Secondary | ICD-10-CM

## 2024-02-25 NOTE — Progress Notes (Signed)
 The patient is now almost 9 weeks status post a left total hip replacement.  She says she is doing very well overall and is ready to ride horses again.  She reports just a little stiffness.  Her left operative hip was smoothly and fluidly with no blocks or rotation.  Her leg lengths appear equal.  Her right hip moves smoothly.  At this point she can continue to increase her activities as comfort allows with no restrictions and she can ride horses.  From our standpoint we will see her back in 6 months unless there are issues.  At that visit we will have a standing low AP pelvis and lateral of her left operative hip.

## 2024-03-17 ENCOUNTER — Ambulatory Visit: Admitting: Orthopaedic Surgery

## 2024-03-17 ENCOUNTER — Encounter: Payer: Self-pay | Admitting: Orthopaedic Surgery

## 2024-03-17 DIAGNOSIS — G8929 Other chronic pain: Secondary | ICD-10-CM

## 2024-03-17 DIAGNOSIS — M79645 Pain in left finger(s): Secondary | ICD-10-CM

## 2024-03-17 MED ORDER — METHYLPREDNISOLONE ACETATE 40 MG/ML IJ SUSP
40.0000 mg | Freq: Once | INTRAMUSCULAR | Status: AC
  Administered 2024-03-17: 40 mg via INTRA_ARTICULAR

## 2024-03-17 NOTE — Addendum Note (Signed)
 Addended by: MARCINE HUSBAND T on: 03/17/2024 03:31 PM   Modules accepted: Orders

## 2024-03-17 NOTE — Progress Notes (Signed)
 My thumb hurts.  She has pain at the base of the first metacarpal carpal joint.  She has no swelling, no redness.  She has negative Finkelstein sign. She has positive grind sign of the left thumb.  NV intact.  Encounter Diagnosis  Name Primary?   Chronic pain of left thumb Yes   Procedure note: After permission from the patient and prep of the left thumb radially, I injected by sterile technique 1 % Xylocaine  and 1 cc Depomedrol 40 into the first metacarpal carpal joint tolerated well.  Return prn.  Call if any problem.  Precautions discussed.  Electronically Signed Lemond Stable, MD 7/23/20253:22 PM

## 2024-03-19 ENCOUNTER — Other Ambulatory Visit: Payer: Self-pay | Admitting: Family Medicine

## 2024-04-16 ENCOUNTER — Encounter: Payer: Self-pay | Admitting: Radiology

## 2024-04-29 ENCOUNTER — Ambulatory Visit (INDEPENDENT_AMBULATORY_CARE_PROVIDER_SITE_OTHER): Payer: Self-pay | Admitting: Family Medicine

## 2024-04-29 VITALS — BP 107/70 | HR 70 | Ht 66.0 in | Wt 154.1 lb

## 2024-04-29 DIAGNOSIS — E7849 Other hyperlipidemia: Secondary | ICD-10-CM

## 2024-04-29 DIAGNOSIS — E038 Other specified hypothyroidism: Secondary | ICD-10-CM | POA: Diagnosis not present

## 2024-04-29 DIAGNOSIS — K219 Gastro-esophageal reflux disease without esophagitis: Secondary | ICD-10-CM

## 2024-04-29 DIAGNOSIS — R7301 Impaired fasting glucose: Secondary | ICD-10-CM

## 2024-04-29 DIAGNOSIS — Z23 Encounter for immunization: Secondary | ICD-10-CM

## 2024-04-29 DIAGNOSIS — E559 Vitamin D deficiency, unspecified: Secondary | ICD-10-CM | POA: Diagnosis not present

## 2024-04-29 NOTE — Assessment & Plan Note (Signed)
 Stable on Synthroid  50 mcg daily Will assess thyroid  levels today Lab Results  Component Value Date   TSH 2.340 11/28/2023

## 2024-04-29 NOTE — Assessment & Plan Note (Signed)
Stable on omeprazole 20mg daily.  

## 2024-04-29 NOTE — Assessment & Plan Note (Signed)
 Patient educated on CDC recommendation for the vaccine. Verbal consent was obtained from the patient, vaccine administered by nurse, no sign of adverse reactions noted at this time. Patient education on arm soreness and use of tylenol or ibuprofen for this patient  was discussed. Patient educated on the signs and symptoms of adverse effect and advise to contact the office if they occur.

## 2024-04-29 NOTE — Patient Instructions (Addendum)
 I appreciate the opportunity to provide care to you today!    Follow up:  6 months  Labs: please stop by the lab today to get your blood drawn (CBC, CMP, TSH, Lipid profile, HgA1c, Vit D)  Schedule medicare annual wellness visit  For a Healthier YOU, I Recommend: Reducing your intake of sugar, sodium, carbohydrates, and saturated fats. Increasing your fiber intake by incorporating more whole grains, fruits, and vegetables into your meals. Setting healthy goals with a focus on lowering your consumption of carbs, sugar, and unhealthy fats. Adding variety to your diet by including a wide range of fruits and vegetables. Cutting back on soda and limiting processed foods as much as possible. Staying active: In addition to taking your weight loss medication, aim for at least 150 minutes of moderate-intensity physical activity each week for optimal results.   Please follow up if your symptoms worsen or fail to improve.    Please continue to a heart-healthy diet and increase your physical activities. Try to exercise for at least five days a week.    It was a pleasure to see you and I look forward to continuing to work together on your health and well-being. Please do not hesitate to call the office if you need care or have questions about your care.  In case of emergency, please visit the Emergency Department for urgent care, or contact our clinic at [phone] to schedule an appointment. We're here to help you!   Have a wonderful day and week. With Gratitude, Colvin Blatt MSN, FNP-BC

## 2024-04-29 NOTE — Progress Notes (Signed)
 Established Patient Office Visit  Subjective:  Patient ID: Anita Berry, female    DOB: Dec 08, 1960  Age: 63 y.o. MRN: [id number]  CC:  Chief Complaint  Patient presents with   Follow-up    5 months, incontinence of urine, vit D def.     HPI Anita Berry is a 63 y.o. female with past medical history of GERD, hypothyroidism presents for f/u of  chronic medical conditions.  For the details of today's visit, please refer to the assessment and plan.     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  Social History   Socioeconomic History   Marital status: Single    Spouse name: Not on file   Number of children: 0   Years of education: Not on file   Highest education level: Bachelor's degree (e.g., BA, AB, BS)  Occupational History   Occupation: Retired    Comment: Naval architect- consulting  Tobacco Use   Smoking status: Never   Smokeless tobacco: Never  Vaping Use   Vaping status: Never Used  Substance and Sexual Activity   Alcohol use: Not Currently    Comment: none recently   Drug use: Never   Sexual activity: Not Currently  Other Topics Concern   Not on file  Social History Narrative   Never been married; boyfriend when she was 14 committed suicide, and she hasn't had solid relationship since   Social Drivers of Health   Financial Resource Strain: High Risk (11/27/2023)   Overall Financial Resource Strain (CARDIA)    Difficulty of Paying Living Expenses:  Very hard  Food Insecurity: No Food Insecurity (12/26/2023)   Hunger Vital Sign    Worried About Running Out of Food in the Last Year: Never true    Ran Out of Food in the Last Year: Never true  Transportation Needs: No Transportation Needs (12/26/2023)   PRAPARE - Administrator, Civil Service (Medical): No    Lack of Transportation (Non-Medical): No  Physical Activity: Sufficiently Active (11/27/2023)   Exercise Vital Sign    Days of Exercise per Week: 7 days    Minutes of Exercise per Session: 140 min  Stress: No Stress Concern Present (11/27/2023)   [employer] of Occupational Health - Occupational Stress Questionnaire    Feeling of Stress : Only a little  Social Connections: Socially Isolated (11/27/2023)   Social Connection and Isolation Panel    Frequency of Communication with Friends and Family: More than three times a week    Frequency of Social Gatherings with Friends and Family: Never    Attends Religious Services: Never    Database administrator or Organizations: No    Attends Banker Meetings: Not on file    Marital Status: Never married  Intimate Partner Violence: Not At Risk (12/26/2023)   Humiliation, Afraid, Rape, and Kick questionnaire    Fear of Current or  Ex-Partner: No    Emotionally Abused: No    Physically Abused: No    Sexually Abused: No    Outpatient Medications Prior to Visit  Medication Sig Dispense Refill   aspirin  81 MG chewable tablet Chew 1 tablet (81 mg total) by mouth 2 (two) times daily. 30 tablet 0   chlorhexidine  (HIBICLENS ) 4 % external liquid Apply 15 mLs (1 Application total) topically as directed for 30 doses. Use as directed daily for 5 days every other week for 6 weeks. 946 mL 1   donepezil  (ARICEPT ) 5 MG tablet TAKE 1 TABLET(5 MG) BY MOUTH DAILY 90 tablet 1   levothyroxine  (SYNTHROID ) 50 MCG tablet Take 1 tablet (50 mcg total) by mouth daily. 90 tablet 0   methocarbamol  (ROBAXIN ) 500 MG tablet Take 1 tablet (500 mg  total) by mouth every 6 (six) hours as needed for muscle spasms. 30 tablet 1   omeprazole  (PRILOSEC ) 20 MG capsule Take 1 capsule by mouth once daily (Patient taking differently: Take 20 mg by mouth daily as needed (acid reflux).) 90 capsule 0   traZODone  (DESYREL ) 100 MG tablet Take 1 tablet (100 mg total) by mouth at bedtime. 90 tablet 0   venlafaxine  (EFFEXOR ) 75 MG tablet TAKE 1 TABLET(75 MG) BY MOUTH TWICE DAILY 60 tablet 5   Vitamin D , Ergocalciferol , (DRISDOL ) 1.25 MG (50000 UNIT) CAPS capsule Take 1 capsule (50,000 Units total) by mouth every 7 (seven) days. 20 capsule 1   No facility-administered medications prior to visit.    Allergies  Allergen Reactions   Prednisone  Other (See Comments)    Kept her up and sent her into a manic mood    Sulfa Antibiotics Swelling    ROS Review of Systems  Constitutional:  Negative for chills and fever.  Eyes:  Negative for visual disturbance.  Respiratory:  Negative for chest tightness and shortness of breath.   Neurological:  Negative for dizziness and headaches.      Objective:    Physical Exam HENT:     Head: Normocephalic.     Mouth/Throat:     Mouth: Mucous membranes are moist.  Cardiovascular:     Rate and Rhythm: Normal rate.     Heart sounds: Normal heart sounds.  Pulmonary:     Effort: Pulmonary effort is normal.     Breath sounds: Normal breath sounds.  Neurological:     Mental Status: She is alert.     BP 107/70 (BP Location: Left Arm, Patient Position: Sitting, Cuff Size: Large)   Pulse 70   Ht 5' 6 (1.676 m)   Wt 154 lb 1.3 oz (69.9 kg)   SpO2 98%   BMI 24.87 kg/m  Wt Readings from Last 3 Encounters:  04/29/24 154 lb 1.3 oz (69.9 kg)  12/26/23 149 lb 14.6 oz (68 kg)  12/19/23 150 lb (68 kg)    Lab Results  Component Value Date   TSH 2.340 11/28/2023   Lab Results  Component Value Date   WBC 6.0 12/27/2023   HGB 11.2 (L) 12/27/2023   HCT 33.8 (L) 12/27/2023   MCV 96.6 12/27/2023   PLT 130 (L)  12/27/2023   Lab Results  Component Value Date   NA 135 12/27/2023   K 3.0 (L) 12/27/2023   CO2 21 (L) 12/27/2023   GLUCOSE 119 (H) 12/27/2023   BUN 13 12/27/2023   CREATININE 0.79 12/27/2023   BILITOT 0.3 11/28/2023   ALKPHOS 139 (H) 11/28/2023   AST 20 11/28/2023   ALT  9 11/28/2023   PROT 6.3 11/28/2023   ALBUMIN 4.4 11/28/2023   CALCIUM  7.8 (L) 12/27/2023   ANIONGAP 9 12/27/2023   EGFR 64 11/28/2023   Lab Results  Component Value Date   CHOL 187 11/28/2023   Lab Results  Component Value Date   HDL 87 11/28/2023   Lab Results  Component Value Date   LDLCALC 88 11/28/2023   Lab Results  Component Value Date   TRIG 61 11/28/2023   Lab Results  Component Value Date   CHOLHDL 2.1 11/28/2023   Lab Results  Component Value Date   HGBA1C 5.3 11/28/2023      Assessment & Plan:  Other specified hypothyroidism Assessment & Plan: Stable on Synthroid  50 mcg daily Will assess thyroid  levels today Lab Results  Component Value Date   TSH 2.340 11/28/2023      Gastroesophageal reflux disease without esophagitis Assessment & Plan: Stable on omeprazole  20 mg daily   Vitamin D  deficiency Assessment & Plan: Encouraged to increase his intake of vitamin D -rich foods such as fatty fish (e.g., salmon, mackerel, and sardines), fortified dairy products, egg yolks, and fortified cereals.   Orders: -     VITAMIN D  25 Hydroxy (Vit-D Deficiency, Fractures)  Encounter for immunization Assessment & Plan: Patient educated on CDC recommendation for the vaccine. Verbal consent was obtained from the patient, vaccine administered by nurse, no sign of adverse reactions noted at this time. Patient education on arm soreness and use of tylenol  or ibuprofen for this patient  was discussed. Patient educated on the signs and symptoms of adverse effect and advise to contact the office if they occur.   Orders: -     Flu vaccine trivalent PF, 6mos and  older(Flulaval,Afluria,Fluarix,Fluzone) -     Pneumococcal conjugate vaccine 20-valent  IFG (impaired fasting glucose) -     Hemoglobin A1c  TSH (thyroid -stimulating hormone deficiency) -     TSH + free T4  Other hyperlipidemia -     Lipid panel -     CMP14+EGFR -     CBC with Differential/Platelet  Note: This chart has been completed using Engineer, civil (consulting) software, and while attempts have been made to ensure accuracy, certain words and phrases may not be transcribed as intended.    Follow-up: Return in about 6 months (around 10/27/2024).   Sander Remedios, FNP

## 2024-04-29 NOTE — Assessment & Plan Note (Signed)
 Encouraged to increase his intake of vitamin D-rich foods such as fatty fish (e.g., salmon, mackerel, and sardines), fortified dairy products, egg yolks, and fortified cereals.

## 2024-04-30 ENCOUNTER — Ambulatory Visit: Payer: Self-pay | Admitting: Family Medicine

## 2024-04-30 DIAGNOSIS — E559 Vitamin D deficiency, unspecified: Secondary | ICD-10-CM

## 2024-04-30 LAB — HEMOGLOBIN A1C
Est. average glucose Bld gHb Est-mCnc: 108 mg/dL
Hgb A1c MFr Bld: 5.4 % (ref 4.8–5.6)

## 2024-04-30 LAB — TSH+FREE T4
Free T4: 1.33 ng/dL (ref 0.82–1.77)
TSH: 2.13 u[IU]/mL (ref 0.450–4.500)

## 2024-04-30 LAB — CBC WITH DIFFERENTIAL/PLATELET
Basophils Absolute: 0 x10E3/uL (ref 0.0–0.2)
Basos: 1 %
EOS (ABSOLUTE): 0.1 x10E3/uL (ref 0.0–0.4)
Eos: 2 %
Hematocrit: 39.6 % (ref 34.0–46.6)
Hemoglobin: 13.1 g/dL (ref 11.1–15.9)
Immature Grans (Abs): 0 x10E3/uL (ref 0.0–0.1)
Immature Granulocytes: 0 %
Lymphocytes Absolute: 1.1 x10E3/uL (ref 0.7–3.1)
Lymphs: 24 %
MCH: 31.1 pg (ref 26.6–33.0)
MCHC: 33.1 g/dL (ref 31.5–35.7)
MCV: 94 fL (ref 79–97)
Monocytes Absolute: 0.2 x10E3/uL (ref 0.1–0.9)
Monocytes: 5 %
Neutrophils Absolute: 3.1 x10E3/uL (ref 1.4–7.0)
Neutrophils: 68 %
Platelets: 216 x10E3/uL (ref 150–450)
RBC: 4.21 x10E6/uL (ref 3.77–5.28)
RDW: 12.5 % (ref 11.7–15.4)
WBC: 4.6 x10E3/uL (ref 3.4–10.8)

## 2024-04-30 LAB — CMP14+EGFR
ALT: 16 IU/L (ref 0–32)
AST: 26 IU/L (ref 0–40)
Albumin: 4.3 g/dL (ref 3.9–4.9)
Alkaline Phosphatase: 164 IU/L — ABNORMAL HIGH (ref 44–121)
BUN/Creatinine Ratio: 13 (ref 12–28)
BUN: 12 mg/dL (ref 8–27)
Bilirubin Total: 0.4 mg/dL (ref 0.0–1.2)
CO2: 17 mmol/L — ABNORMAL LOW (ref 20–29)
Calcium: 8.9 mg/dL (ref 8.7–10.3)
Chloride: 110 mmol/L — ABNORMAL HIGH (ref 96–106)
Creatinine, Ser: 0.89 mg/dL (ref 0.57–1.00)
Globulin, Total: 1.8 g/dL (ref 1.5–4.5)
Glucose: 87 mg/dL (ref 70–99)
Potassium: 3.9 mmol/L (ref 3.5–5.2)
Sodium: 144 mmol/L (ref 134–144)
Total Protein: 6.1 g/dL (ref 6.0–8.5)
eGFR: 72 mL/min/1.73 (ref >59)

## 2024-04-30 LAB — LIPID PANEL
Chol/HDL Ratio: 2 ratio (ref 0.0–4.4)
Cholesterol, Total: 182 mg/dL (ref 100–199)
HDL: 92 mg/dL (ref >39)
LDL Chol Calc (NIH): 81 mg/dL (ref 0–99)
Triglycerides: 43 mg/dL (ref 0–149)
VLDL Cholesterol Cal: 9 mg/dL (ref 5–40)

## 2024-04-30 LAB — VITAMIN D 25 HYDROXY (VIT D DEFICIENCY, FRACTURES): Vit D, 25-Hydroxy: 37.2 ng/mL (ref 30.0–100.0)

## 2024-04-30 MED ORDER — VITAMIN D (ERGOCALCIFEROL) 1.25 MG (50000 UNIT) PO CAPS: 50000.0000 [IU] | ORAL_CAPSULE | ORAL | 1 refills | Status: AC

## 2024-05-15 ENCOUNTER — Other Ambulatory Visit: Payer: Self-pay | Admitting: Family Medicine

## 2024-05-20 ENCOUNTER — Other Ambulatory Visit: Payer: Self-pay | Admitting: Family Medicine

## 2024-05-25 ENCOUNTER — Other Ambulatory Visit: Payer: Self-pay | Admitting: Family Medicine

## 2024-05-25 DIAGNOSIS — G4700 Insomnia, unspecified: Secondary | ICD-10-CM

## 2024-06-28 ENCOUNTER — Encounter: Payer: Self-pay | Admitting: Radiology

## 2024-07-12 ENCOUNTER — Other Ambulatory Visit: Payer: Self-pay | Admitting: Family Medicine

## 2024-07-12 DIAGNOSIS — K219 Gastro-esophageal reflux disease without esophagitis: Secondary | ICD-10-CM

## 2024-07-12 MED ORDER — OMEPRAZOLE 20 MG PO CPDR: 20.0000 mg | DELAYED_RELEASE_CAPSULE | Freq: Every day | ORAL | 0 refills | Status: AC

## 2024-07-12 NOTE — Telephone Encounter (Signed)
 Copied from CRM #[id number]. Topic: Clinical - Medication Refill >> Jul 12, 2024  8:48 AM Mercedes MATSU wrote: Medication: omeprazole  (PRILOSEC ) 20 MG capsule  Has the patient contacted their pharmacy? Yes (Agent: If no, request that the patient contact the pharmacy for the refill. If patient does not wish to contact the pharmacy document the reason why and proceed with request.) (Agent: If yes, when and what did the pharmacy advise?)  This is the patient's preferred pharmacy:  [facility] [phone] - [place], [place] - [address] AT [facility] [address] [place] [address] Phone: [phone] Fax: [phone]  Is this the correct pharmacy for this prescription? Yes If no, delete pharmacy and type the correct one.   Has the prescription been filled recently? Yes  Is the patient out of the medication? Yes  Has the patient been seen for an appointment in the last year OR does the patient have an upcoming appointment? Yes  Can we respond through MyChart? Yes  Agent: Please be advised that Rx refills may take up to 3 business days. We ask that you follow-up with your pharmacy.

## 2024-07-27 ENCOUNTER — Other Ambulatory Visit: Payer: Self-pay | Admitting: Family Medicine

## 2024-07-27 DIAGNOSIS — F3131 Bipolar disorder, current episode depressed, mild: Secondary | ICD-10-CM

## 2024-08-13 ENCOUNTER — Other Ambulatory Visit: Payer: Self-pay | Admitting: Family Medicine

## 2024-08-20 ENCOUNTER — Other Ambulatory Visit: Payer: Self-pay | Admitting: Family Medicine

## 2024-08-20 DIAGNOSIS — G4700 Insomnia, unspecified: Secondary | ICD-10-CM

## 2024-08-30 ENCOUNTER — Encounter: Payer: Self-pay | Admitting: Orthopaedic Surgery

## 2024-08-30 ENCOUNTER — Ambulatory Visit (INDEPENDENT_AMBULATORY_CARE_PROVIDER_SITE_OTHER)

## 2024-08-30 ENCOUNTER — Ambulatory Visit: Admitting: Orthopaedic Surgery

## 2024-08-30 DIAGNOSIS — Z96642 Presence of left artificial hip joint: Secondary | ICD-10-CM

## 2024-08-30 NOTE — Progress Notes (Signed)
 The patient is an active 65 year old female who is now 8 months status post a left anterior total hip replacement to treat significant left hip pain and arthritis.  She reports that she is doing excellent.  She has good range of motion and strength.  She is walking without assistive device and no limp.  On exam her leg lengths are equal.  Her right negative hip moves smoothly and fluidly.  Her left operative hip moves smoothly and fluidly with no blocks rotation.  An AP pelvis and lateral left hip shows a well-seated left total hip arthroplasty with no complicating features.  The right native hip is normal.  At this point follow-up for her left hip can be as needed.  If she does develop any issues with that at all she knows to not hesitate to let us  know and she also knows we can see her for other orthopedic issues as well.

## 2024-09-18 ENCOUNTER — Other Ambulatory Visit: Payer: Self-pay | Admitting: Family Medicine

## 2024-10-28 ENCOUNTER — Ambulatory Visit: Admitting: Family Medicine
